# Patient Record
Sex: Female | Born: 1975
Health system: Southern US, Community
[De-identification: ages and names within clinical notes are randomized; demographics above are authoritative.]

## PROBLEM LIST (undated history)

## (undated) DIAGNOSIS — F329 Major depressive disorder, single episode, unspecified: Secondary | ICD-10-CM

## (undated) DIAGNOSIS — T7840XA Allergy, unspecified, initial encounter: Secondary | ICD-10-CM

## (undated) DIAGNOSIS — F32A Depression, unspecified: Secondary | ICD-10-CM

## (undated) HISTORY — DX: Depression, unspecified: F32.A

## (undated) HISTORY — DX: Major depressive disorder, single episode, unspecified: F32.9

## (undated) HISTORY — DX: Allergy, unspecified, initial encounter: T78.40XA

---

## 2005-08-13 ENCOUNTER — Other Ambulatory Visit: Admission: RE | Admit: 2005-08-13 | Discharge: 2005-08-13 | Payer: Self-pay | Admitting: Obstetrics and Gynecology

## 2009-10-24 ENCOUNTER — Inpatient Hospital Stay (HOSPITAL_COMMUNITY): Admission: AD | Admit: 2009-10-24 | Discharge: 2009-10-25 | Payer: Self-pay | Admitting: Obstetrics and Gynecology

## 2010-11-27 LAB — CBC
HCT: 36.9 % (ref 36.0–46.0)
Hemoglobin: 12.7 g/dL (ref 12.0–15.0)
Hemoglobin: 14.4 g/dL (ref 12.0–15.0)
MCHC: 34.3 g/dL (ref 30.0–36.0)
MCV: 92.3 fL (ref 78.0–100.0)
Platelets: 179 10*3/uL (ref 150–400)
RBC: 4 MIL/uL (ref 3.87–5.11)
RBC: 4.54 MIL/uL (ref 3.87–5.11)
WBC: 11.3 10*3/uL — ABNORMAL HIGH (ref 4.0–10.5)
WBC: 12.3 10*3/uL — ABNORMAL HIGH (ref 4.0–10.5)

## 2010-11-27 LAB — RPR: RPR Ser Ql: NONREACTIVE

## 2012-12-23 ENCOUNTER — Telehealth: Payer: Self-pay | Admitting: Family Medicine

## 2012-12-23 ENCOUNTER — Encounter: Payer: Self-pay | Admitting: Family Medicine

## 2012-12-23 ENCOUNTER — Ambulatory Visit (INDEPENDENT_AMBULATORY_CARE_PROVIDER_SITE_OTHER): Payer: BC Managed Care – PPO | Admitting: Family Medicine

## 2012-12-23 ENCOUNTER — Ambulatory Visit: Payer: Self-pay | Admitting: Family Medicine

## 2012-12-23 VITALS — BP 118/80 | HR 84 | Temp 97.5°F | Ht 67.0 in | Wt 130.0 lb

## 2012-12-23 DIAGNOSIS — Z7689 Persons encountering health services in other specified circumstances: Secondary | ICD-10-CM

## 2012-12-23 DIAGNOSIS — H6122 Impacted cerumen, left ear: Secondary | ICD-10-CM

## 2012-12-23 DIAGNOSIS — J309 Allergic rhinitis, unspecified: Secondary | ICD-10-CM

## 2012-12-23 DIAGNOSIS — Z7189 Other specified counseling: Secondary | ICD-10-CM

## 2012-12-23 DIAGNOSIS — H6120 Impacted cerumen, unspecified ear: Secondary | ICD-10-CM

## 2012-12-23 LAB — LIPID PANEL
HDL: 48.3 mg/dL (ref >39.00)
Total CHOL/HDL Ratio: 3
Triglycerides: 93 mg/dL (ref 0.0–149.0)
VLDL: 18.6 mg/dL (ref 0.0–40.0)

## 2012-12-23 LAB — HEMOGLOBIN A1C: Hgb A1c MFr Bld: 5.2 % (ref 4.6–6.5)

## 2012-12-23 MED ORDER — FLUTICASONE PROPIONATE 50 MCG/ACT NA SUSP: 2.0000 | Freq: Every day | NASAL | Status: DC

## 2012-12-23 NOTE — Telephone Encounter (Signed)
Called and spoke with pt and pt is aware.  

## 2012-12-23 NOTE — Patient Instructions (Addendum)
-  We have ordered labs or studies at this visit. It can take up to 1-2 weeks for results and processing. We will contact you with instructions IF your results are abnormal. Normal results will be released to your MYCHART. If you have not heard from us or can not find your results in MYCHART in 2 weeks please contact our office.  -PLEASE SIGN UP FOR MYCHART TODAY   We recommend the following healthy lifestyle measures: - eat a healthy diet consisting of lots of vegetables, fruits, beans, nuts, seeds, healthy meats such as white chicken and fish and whole grains.  - avoid fried foods, fast food, processed foods, sodas, red meet and other fattening foods.  - get a least 150 minutes of aerobic exercise per week.   Follow up in: 1 year or as needed  

## 2012-12-23 NOTE — Telephone Encounter (Signed)
Labs look great.

## 2012-12-23 NOTE — Progress Notes (Signed)
Chief Complaint  Patient presents with  . Establish Care    HPI:  Holly Riddle is here to establish care. Sees Dr. Renaldo Fiddler for gyn appointments. Last PCP and physical: had physical last april  Has the following chronic problems and concerns today:  There is no problem list on file for this patient.  L Ear Fill up: -feels like clogged up and feels like can't hear out of it -has seasonal allergies - sneezing, nasal symptoms, eye watery -just started some claritin -has seen allergist in the past  Health Maintenance: Had tdap in 2010  ROS: See pertinent positives and negatives per HPI.  Past Medical History  Diagnosis Date  . Allergy   . Depression     Family History  Problem Relation Age of Onset  . Alcohol abuse Father   . Hyperlipidemia Father   . Heart disease Father 76  . Hypertension Father   . Cancer Maternal Grandmother     breast  . Cancer Maternal Grandfather     lung  . Diabetes Maternal Grandfather   . Arthritis Paternal Grandmother   . Diabetes Paternal Grandmother   . Mental illness Paternal Grandmother     History   Social History  . Marital Status: Married    Spouse Name: N/A    Number of Children: N/A  . Years of Education: N/A   Social History Main Topics  . Smoking status: Never Smoker   . Smokeless tobacco: None  . Alcohol Use: Yes     Comment: socailly, 1 drink 4 nights per week  . Drug Use: No  . Sexually Active: None   Other Topics Concern  . None   Social History Narrative   Work or School: homemaker      Home Situation: husband, 3 children      Spiritual Beliefs: Christian      Lifestyle: runs - 8 miles per week, diet is goood             Current outpatient prescriptions:loratadine (CLARITIN) 10 MG tablet, Take 10 mg by mouth daily., Disp: , Rfl: ;  fluticasone (FLONASE) 50 MCG/ACT nasal spray, Place 2 sprays into the nose daily., Disp: 16 g, Rfl: 6  EXAM:  Filed Vitals:   12/23/12 0811  BP: 118/80  Pulse: 84   Temp: 97.5 F (36.4 C)    Body mass index is 20.36 kg/(m^2).  GENERAL: vitals reviewed and listed above, alert, oriented, appears well hydrated and in no acute distress  HEENT: atraumatic, conjunttiva clear, no obvious abnormalities on inspection of external nose and ears, normal appearance of ear canals and TMs except cerumen impaction on L, clear nasal congestion, pale boggy turbinates, mild post oropharyngeal erythema with PND, no tonsillar edema or exudate, no sinus TTP  NECK: no obvious masses on inspection  LUNGS: clear to auscultation bilaterally, no wheezes, rales or rhonchi, good air movement  CV: HRRR, no peripheral edema  MS: moves all extremities without noticeable abnormality  PSYCH: pleasant and cooperative, no obvious depression or anxiety  ASSESSMENT AND PLAN:  Discussed the following assessment and plan:  Allergic rhinitis - Plan: fluticasone (FLONASE) 50 MCG/ACT nasal spray  Cerumen impaction, left  Encounter to establish care -We reviewed the PMH, PSH, FH, SH, Meds and Allergies. -cerumen removed and hearing returned -add flonase for AR along with claritin - risks discussed, return prn -NON-FASTING labs  -Patient advised to return or notify a doctor immediately if symptoms worsen or persist or new concerns arise.  Patient Instructions  -  We have ordered labs or studies at this visit. It can take up to 1-2 weeks for results and processing. We will contact you with instructions IF your results are abnormal. Normal results will be released to your [facility]. If you have not heard from [place] or can not find your results in [facility] in 2 weeks please contact our office.  -PLEASE SIGN UP FOR MYCHART TODAY   We recommend the following healthy lifestyle measures: - eat a healthy diet consisting of lots of vegetables, fruits, beans, nuts, seeds, healthy meats such as white chicken and fish and whole grains.  - avoid fried foods, fast food, processed foods, sodas, red  meet and other fattening foods.  - get a least 150 minutes of aerobic exercise per week.   Follow up in:      Kriste Basque R.

## 2013-12-19 ENCOUNTER — Ambulatory Visit (INDEPENDENT_AMBULATORY_CARE_PROVIDER_SITE_OTHER): Payer: BC Managed Care – PPO | Admitting: Family Medicine

## 2013-12-19 ENCOUNTER — Encounter: Payer: Self-pay | Admitting: Family Medicine

## 2013-12-19 VITALS — BP 102/70 | Temp 97.9°F | Wt 131.0 lb

## 2013-12-19 DIAGNOSIS — S61209A Unspecified open wound of unspecified finger without damage to nail, initial encounter: Secondary | ICD-10-CM

## 2013-12-19 DIAGNOSIS — L609 Nail disorder, unspecified: Secondary | ICD-10-CM

## 2013-12-19 DIAGNOSIS — J309 Allergic rhinitis, unspecified: Secondary | ICD-10-CM

## 2013-12-19 DIAGNOSIS — S61219A Laceration without foreign body of unspecified finger without damage to nail, initial encounter: Secondary | ICD-10-CM

## 2013-12-19 DIAGNOSIS — L608 Other nail disorders: Secondary | ICD-10-CM

## 2013-12-19 MED ORDER — FLUTICASONE PROPIONATE 50 MCG/ACT NA SUSP: 2.0000 | Freq: Every day | NASAL | Status: DC

## 2013-12-19 NOTE — Progress Notes (Signed)
Chief Complaint  Patient presents with  . left pointer finger sore    HPI:  Finger Laceration: -occurred a few days ago when fell -R index finger -not really painful, no swelling or oozing -cleaned well and applying neopsporin  Allergic Rhinitis: -on flonase and antihistamine - needs refill on flonase  Tonail issues: -most toenails a little discolered -keeps painted  ROS: See pertinent positives and negatives per HPI.  Past Medical History  Diagnosis Date  . Allergy   . Depression     No past surgical history on file.  Family History  Problem Relation Age of Onset  . Alcohol abuse Father   . Hyperlipidemia Father   . Heart disease Father [id number]  . Hypertension Father   . Cancer Maternal Grandmother     breast  . Cancer Maternal Grandfather     lung  . Diabetes Maternal Grandfather   . Arthritis Paternal Grandmother   . Diabetes Paternal Grandmother   . Mental illness Paternal Grandmother     History   Social History  . Marital Status: Married    Spouse Name: N/A    Number of Children: N/A  . Years of Education: N/A   Social History Main Topics  . Smoking status: Never Smoker   . Smokeless tobacco: None  . Alcohol Use: Yes     Comment: socailly, 1 drink 4 nights per week  . Drug Use: No  . Sexual Activity: None   Other Topics Concern  . None   Social History Narrative   Work or School: homemaker      Home Situation: husband, 3 children      Spiritual Beliefs: Christian      Lifestyle: runs - 8 miles per week, diet is goood             Current outpatient prescriptions:fluticasone (FLONASE) 50 MCG/ACT nasal spray, Place 2 sprays into both nostrils daily., Disp: 16 g, Rfl: 6;  loratadine (CLARITIN) 10 MG tablet, Take 10 mg by mouth daily., Disp: , Rfl:   EXAM:  Filed Vitals:   12/19/13 1101  BP: 102/70  Temp: 97.9 F (36.6 C)    Body mass index is 20.51 kg/(m^2).  GENERAL: vitals reviewed and listed above, alert, oriented, appears  well hydrated and in no acute distress  HEENT: atraumatic, conjunttiva clear, no obvious abnormalities on inspection of external nose and ears  NECK: no obvious masses on inspection  SKIN: healing abrasion R index finger, good granulation tissue, no signs of infection; most tonails mily friable with white discoloration  MS: moves all extremities without noticeable abnormality  PSYCH: pleasant and cooperative, no obvious depression or anxiety  ASSESSMENT AND PLAN:  Discussed the following assessment and plan:  Allergic rhinitis - Plan: fluticasone (FLONASE) 50 MCG/ACT nasal spray -refilled flonase  Finger laceration -appears to be healing well, wound care recs and return precautions provided  Toenail deformity -discussed potential etiologies and treatments and risks -she opted to try topical tx, no nail polish and consider oral antifungal, but holding off on this for now  -Patient advised to return or notify a doctor immediately if symptoms worsen or persist or new concerns arise.  There are no Patient Instructions on file for this visit.   Terressa KoyanagiHannah R. Kyler Lerette

## 2013-12-19 NOTE — Progress Notes (Signed)
Pre visit review using our clinic review tool, if applicable. No additional management support is needed unless otherwise documented below in the visit note. 

## 2014-10-30 ENCOUNTER — Other Ambulatory Visit: Payer: Self-pay | Admitting: *Deleted

## 2014-10-30 DIAGNOSIS — J3089 Other allergic rhinitis: Secondary | ICD-10-CM

## 2014-10-30 MED ORDER — FLUTICASONE PROPIONATE 50 MCG/ACT NA SUSP: 2.0000 | Freq: Every day | NASAL | Status: AC

## 2014-10-30 NOTE — Telephone Encounter (Signed)
Rx done. 

## 2015-01-30 ENCOUNTER — Encounter: Payer: Self-pay | Admitting: Family Medicine

## 2015-01-30 ENCOUNTER — Ambulatory Visit (INDEPENDENT_AMBULATORY_CARE_PROVIDER_SITE_OTHER): Payer: 59 | Admitting: Family Medicine

## 2015-01-30 VITALS — BP 100/80 | HR 75 | Temp 97.4°F | Ht 67.0 in | Wt 129.9 lb

## 2015-01-30 DIAGNOSIS — M79672 Pain in left foot: Secondary | ICD-10-CM | POA: Diagnosis not present

## 2015-01-30 DIAGNOSIS — M79671 Pain in right foot: Secondary | ICD-10-CM

## 2015-01-30 NOTE — Progress Notes (Signed)
HPI:  Acute visit for:  1)Foot Pain: -started 2 weeks ago after increasing long runs in preparation for half marathon 1 week ago -pain is in both feet, constant, in bilat plantar aspect of feet L>R,  worse with on feet, standing or with running -has not done any running in the last 1 week and not much improved -takes ibuprofen once daily and helps a little -she wore orthotic from off and running the last week of training before half -denies: fevers, malaise, weakness numbness  ROS: See pertinent positives and negatives per HPI.  Past Medical History  Diagnosis Date  . Allergy   . Depression     No past surgical history on file.  Family History  Problem Relation Age of Onset  . Alcohol abuse Father   . Hyperlipidemia Father   . Heart disease Father [id number]  . Hypertension Father   . Cancer Maternal Grandmother     breast  . Cancer Maternal Grandfather     lung  . Diabetes Maternal Grandfather   . Arthritis Paternal Grandmother   . Diabetes Paternal Grandmother   . Mental illness Paternal Grandmother     History   Social History  . Marital Status: Married    Spouse Name: N/A  . Number of Children: N/A  . Years of Education: N/A   Social History Main Topics  . Smoking status: Never Smoker   . Smokeless tobacco: Not on file  . Alcohol Use: Yes     Comment: socailly, 1 drink 4 nights per week  . Drug Use: No  . Sexual Activity: Not on file   Other Topics Concern  . None   Social History Narrative   Work or School: homemaker      Home Situation: husband, 3 children      Spiritual Beliefs: Christian      Lifestyle: runs - 8 miles per week, diet is goood              Current outpatient prescriptions:  .  fluticasone (FLONASE) 50 MCG/ACT nasal spray, Place 2 sprays into both nostrils daily., Disp: 16 g, Rfl: 3 .  loratadine (CLARITIN) 10 MG tablet, Take 10 mg by mouth daily., Disp: , Rfl:   EXAM:  Filed Vitals:   01/30/15 1029  BP: 100/80  Pulse: 75   Temp: 97.4 F (36.3 C)    Body mass index is 20.34 kg/(m^2).  GENERAL: vitals reviewed and listed above, alert, oriented, appears well hydrated and in no acute distress  HEENT: atraumatic, conjunttiva clear, no obvious abnormalities on inspection of external nose and ears  CV: HRRR, no peripheral edema  MS/SKIN/NEURO: moves all extremities without noticeable abnormality Normal gait, mild talus valgus with mild pes planus L and some collapes of bilat ant arch with callous formation mild r ant plantar foot. Shoes show mild medial post wear R and bilat mild medial toe wear. No sig soft tissue, joint or bony TTP on exam, no swelling, normal ant/post drawer, normal talar tilt, mildly tight gastrocs bilat, normal strength throughout and NV intact bilat in feet/toes.  PSYCH: pleasant and cooperative, no obvious depression or anxiety  ASSESSMENT AND PLAN:  Discussed the following assessment and plan:  Foot pain, bilateral - Plan: DG Foot Complete Left -suspect strain, possible plantar fasciitis, orthotic may be causing compression - opted to hold off on this for now -opted for foot wear and activity change, xrays to exclude possible comp fx - unlikely -follow up in 1 month - may  have her see Dr. Katrinka Blazing if persists  -Patient advised to return or notify a doctor immediately if symptoms worsen or persist or new concerns arise.  Patient Instructions  BEFORE YOU LEAVE: -xray sheet -follow up in 3-4 weeks if persist  Try the [place] sock at night  No running or jogging for the next few weeks  Stick to cycling, elliptical and walking for the next few weeks  Go without the orthotic for now  Tylenol 500-1000mg  up to 3 times per day or aleve 1-2 tablets up to 2 times per day     Kriste Basque R.

## 2015-01-30 NOTE — Patient Instructions (Signed)
BEFORE YOU LEAVE: -xray sheet -follow up in 3-4 weeks if persist  Try the [place] sock at night  No running or jogging for the next few weeks  Stick to cycling, elliptical and walking for the next few weeks  Go without the orthotic for now  Tylenol 500-1000mg  up to 3 times per day or aleve 1-2 tablets up to 2 times per day

## 2015-01-30 NOTE — Progress Notes (Signed)
Pre visit review using our clinic review tool, if applicable. No additional management support is needed unless otherwise documented below in the visit note. 

## 2015-02-19 ENCOUNTER — Ambulatory Visit (INDEPENDENT_AMBULATORY_CARE_PROVIDER_SITE_OTHER)
Admission: RE | Admit: 2015-02-19 | Discharge: 2015-02-19 | Disposition: A | Discharge status: Home / Self Care | Payer: 59 | Source: Ambulatory Visit | Attending: Family Medicine | Admitting: Family Medicine

## 2015-02-19 ENCOUNTER — Other Ambulatory Visit: Payer: Self-pay | Admitting: Family Medicine

## 2015-02-19 DIAGNOSIS — M79671 Pain in right foot: Secondary | ICD-10-CM | POA: Diagnosis not present

## 2015-03-05 ENCOUNTER — Ambulatory Visit: Payer: 59 | Admitting: Family Medicine

## 2015-03-13 ENCOUNTER — Encounter: Payer: Self-pay | Admitting: Family Medicine

## 2015-03-13 ENCOUNTER — Ambulatory Visit (INDEPENDENT_AMBULATORY_CARE_PROVIDER_SITE_OTHER): Payer: 59 | Admitting: Family Medicine

## 2015-03-13 ENCOUNTER — Other Ambulatory Visit (INDEPENDENT_AMBULATORY_CARE_PROVIDER_SITE_OTHER): Payer: 59

## 2015-03-13 VITALS — BP 106/64 | HR 74 | Ht 67.0 in | Wt 130.0 lb

## 2015-03-13 DIAGNOSIS — M2580 Other specified joint disorders, unspecified joint: Secondary | ICD-10-CM | POA: Diagnosis not present

## 2015-03-13 DIAGNOSIS — M79671 Pain in right foot: Secondary | ICD-10-CM

## 2015-03-13 NOTE — Assessment & Plan Note (Signed)
Patient seems to be improving with her decreasing her running recently. We discussed icing regimen and patient given topical anti-inflammatory's. We discussed proper shoewear. We discussed slowly increasing her activity over the course the next 3-4 weeks. We discussed vitamin D supplementation. Patient come back and see me again in 3-4 weeks for further evaluation and treatment.

## 2015-03-13 NOTE — Progress Notes (Signed)
Pre visit review using our clinic review tool, if applicable. No additional management support is needed unless otherwise documented below in the visit note. 

## 2015-03-13 NOTE — Progress Notes (Signed)
Tawana ScaleZach Masoud Nyce D.O. [facility] [address] [place], [place] [address] Phone: [phone]) [phone] Subjective:    I'm seeing this patient by the request  of:  Terressa KoyanagiKIM, HANNAH R., DO   CC: Right foot pain  [id number]:Subjective Clearnce HastenKristen Riddle is a 39 y.o. female coming in with complaint of right foot pain. Patient is an avid runner and has been running greater distances recently. Patient has noticed that she is started having more of right foot pain. Patient has been preparing for a half marathon. Patient actually states that the pain seems to be in both of the feet. Patient describes the pain as more of a dull throbbing aching sensation that is worse with activity. Seems to be better with rest. Patient has taken a week off from running and has noticed significant improvement. Patient denies any fevers or chills or any abnormal weight loss. Patient denies any numbness of the toes. Patient states a regular daily activities have no pain. Patient states it's only with running itself. Has changed shoes with no significant improvement. Rates the severity of 5 out of 10. Patient did run a half marathon and did well but then the pain started seems to get worse 2 weeks ago.  Past Medical History  Diagnosis Date  . Allergy   . Depression    No past surgical history on file. History  Substance Use Topics  . Smoking status: Never Smoker   . Smokeless tobacco: Not on file  . Alcohol Use: Yes     Comment: socailly, 1 drink 4 nights per week   No Known Allergies Family History  Problem Relation Age of Onset  . Alcohol abuse Father   . Hyperlipidemia Father   . Heart disease Father [id number]  . Hypertension Father   . Cancer Maternal Grandmother     breast  . Cancer Maternal Grandfather     lung  . Diabetes Maternal Grandfather   . Arthritis Paternal Grandmother   . Diabetes Paternal Grandmother   . Mental illness Paternal Grandmother         Past medical history, social, surgical and family history  all reviewed in electronic medical record.   Review of Systems: No headache, visual changes, nausea, vomiting, diarrhea, constipation, dizziness, abdominal pain, skin rash, fevers, chills, night sweats, weight loss, swollen lymph nodes, body aches, joint swelling, muscle aches, chest pain, shortness of breath, mood changes.   Objective Blood pressure 106/64, pulse 74, height 5\' 7"  (1.702 m), weight 130 lb (58.968 kg), SpO2 98 %.  General: No apparent distress alert and oriented x3 mood and affect normal, dressed appropriately.  HEENT: Pupils equal, extraocular movements intact  Respiratory: Patient's speak in full sentences and does not appear short of breath  Cardiovascular: No lower extremity edema, non tender, no erythema  Skin: Warm dry intact with no signs of infection or rash on extremities or on axial skeleton.  Abdomen: Soft nontender  Neuro: Cranial nerves II through XII are intact, neurovascularly intact in all extremities with 2+ DTRs and 2+ pulses.  Lymph: No lymphadenopathy of posterior or anterior cervical chain or axillae bilaterally.  Gait normal with good balance and coordination.  MSK:  Non tender with full range of motion and good stability and symmetric strength and tone of shoulders, elbows, wrist, hip, knees bilaterally.  Ankle: Right No visible erythema or swelling. Range of motion is full in all directions. Strength is 5/5 in all directions. Stable lateral and medial ligaments; squeeze test and kleiger test unremarkable; Talar  dome nontender; No pain at base of 5th MT; No tenderness over cuboid; No tenderness over N spot or navicular prominence No tenderness on posterior aspects of lateral and medial malleolus Tender to palpation on the plantar aspect of the first and second metatarsal bones. No sign of peroneal tendon subluxations or tenderness to palpation Negative tarsal tunnel tinel's Able to walk 4 steps.  MSK US performed of: Right This study was ordered,  performed, and interpreted by Terrilee Files D.O.  Foot/Ankle:   All structures visualized.   Talar dome unremarkable  Ankle mortise without effusion. Peroneus longus and brevis tendons unremarkable on long and transverse views without sheath effusions. Posterior tibialis, flexor hallucis longus, and flexor digitorum longus tendons unremarkable on long and transverse views without sheath effusions. Achilles tendon visualized along length of tendon and unremarkable on long and transverse views without sheath effusion. Anterior Talofibular Ligament and Calcaneofibular Ligaments unremarkable and intact. Deltoid Ligament unremarkable and intact. Plantar fascia intact and without effusion, normal thickness. No increased doppler signal, cap sign, or thickening of tibial cortex. Patient though does have some mild hypoechoic changes of the sesamoid bones on their the first and second toes. Very nonspecific but mild increase in Doppler flow.   IMPRESSION: Sesamoiditis      Impression and Recommendations:     This case required medical decision making of moderate complexity.

## 2015-03-13 NOTE — Patient Instructions (Signed)
Nice to meet you You can try Pennsaid (topical medicine) on your foot, 2x/day as needed Ice/ice bath after activity and before bedtime  Wear shoes inside to give your foot support and padding  Try Spenco orthotics - can find online (with metatarsal pad) No running this week - cross train bike/ellipse/swimming Vitamin D - /day See me again in 3 weeks.

## 2015-04-03 ENCOUNTER — Ambulatory Visit: Payer: 59 | Admitting: Family Medicine

## 2015-06-25 ENCOUNTER — Ambulatory Visit (INDEPENDENT_AMBULATORY_CARE_PROVIDER_SITE_OTHER): Payer: 59 | Admitting: Family Medicine

## 2015-06-25 ENCOUNTER — Encounter: Payer: Self-pay | Admitting: Family Medicine

## 2015-06-25 VITALS — BP 124/70 | HR 61 | Ht 67.0 in | Wt 131.0 lb

## 2015-06-25 DIAGNOSIS — M2580 Other specified joint disorders, unspecified joint: Secondary | ICD-10-CM

## 2015-06-25 DIAGNOSIS — M216X1 Other acquired deformities of right foot: Secondary | ICD-10-CM | POA: Diagnosis not present

## 2015-06-25 MED ORDER — PREDNISONE 50 MG PO TABS: 50.0000 mg | ORAL_TABLET | Freq: Every day | ORAL | Status: DC

## 2015-06-25 NOTE — Progress Notes (Signed)
Pre visit review using our clinic review tool, if applicable. No additional management support is needed unless otherwise documented below in the visit note. 

## 2015-06-25 NOTE — Patient Instructions (Signed)
Prednisone daily for 5 days.  Ice still at night Vitamin D is most important We will get you set up for orthotics.  Start next week. Only do it 3 times a week.  Start a walk-run progression: - Initially start one minute walking than one minute running for 20 mins in the first week,   then 25 mins during the second week, then 30 mins afterwards.  Once you have reached 30 mins: - Run 2 mins, then walk 1 min. -Then run 3 mins, and walk 1 min. -Then run 4 mins, and walk 1 min. -Then run 5 mins, and walk 1 min. -Slowly build up weekly to running 30 mins nonstop.  If painful at any of the steps, back up one step.  See me again 3 weeks after the orthotics.

## 2015-06-25 NOTE — Assessment & Plan Note (Signed)
Patient attends to have more of a sesamoiditis secondary to the breakdown of the transverse arch. Patient does have a narrow hindfoot. Patient be fitted with custom orthotics. We discussed prednisone for short course. Patient is going to take this daily for 5 days. We discussed icing regimen as well as topical anti-inflammatories. Patient is going to continue some the exercises. Patient will place her shoes differently. We discussed proper shoewear. Patient come back and see me again in 4 weeks for further evaluation and treatment. Custom orthotics ordered.  Spent  25 minutes with patient face-to-face and had greater than 50% of counseling including as described above in assessment and plan.

## 2015-06-25 NOTE — Progress Notes (Signed)
[name] [address] [place], [place] [address] Phone: [phone] Subjective:    I'm seeing this patient by the request  of:  Terressa Koyanagi., DO   CC: Right foot pain follow-up  [id number] Holly Riddle is a 39 y.o. female coming in with complaint of right foot pain. Patient was seen previously and did have more of a sesamoiditis. Patient was to try conservative therapy including icing, proper shoe wear, in augmenting some of her exercises. Patient states she was doing better but when she tries to increase her walking or running she starts having increasing pain again. Patient states that it still seems to be around the toes. Having some mild pain at the heel as well. Denies any other significant changes. Patient did get the over-the-counter orthotics and continues to do the vitamins on a regular basis. Maybe not as bad as it was previously but not making any improvement she would want to him is unable to start increasing her running on a regular basis.    Past Medical History  Diagnosis Date  . Allergy   . Depression    No past surgical history on file. Social History  Substance Use Topics  . Smoking status: Never Smoker   . Smokeless tobacco: None  . Alcohol Use: Yes     Comment: socailly, 1 drink 4 nights per week   No Known Allergies Family History  Problem Relation Age of Onset  . Alcohol abuse Father   . Hyperlipidemia Father   . Heart disease Father 20  . Hypertension Father   . Cancer Maternal Grandmother     breast  . Cancer Maternal Grandfather     lung  . Diabetes Maternal Grandfather   . Arthritis Paternal Grandmother   . Diabetes Paternal Grandmother   . Mental illness Paternal Grandmother         Past medical history, social, surgical and family history all reviewed in electronic medical record.   Review of Systems: No headache, visual changes, nausea, vomiting, diarrhea, constipation, dizziness, abdominal pain,  skin rash, fevers, chills, night sweats, weight loss, swollen lymph nodes, body aches, joint swelling, muscle aches, chest pain, shortness of breath, mood changes.   Objective Blood pressure 124/70, pulse 61, height  (1.702 m), weight 131 lb (59.421 kg), SpO2 98 %.  General: No apparent distress alert and oriented x3 mood and affect normal, dressed appropriately.  HEENT: Pupils equal, extraocular movements intact  Respiratory: Patient's speak in full sentences and does not appear short of breath  Cardiovascular: No lower extremity edema, non tender, no erythema  Skin: Warm dry intact with no signs of infection or rash on extremities or on axial skeleton.  Abdomen: Soft nontender  Neuro: Cranial nerves II through XII are intact, neurovascularly intact in all extremities with 2+ DTRs and 2+ pulses.  Lymph: No lymphadenopathy of posterior or anterior cervical chain or axillae bilaterally.  Gait normal with good balance and coordination.  MSK:  Non tender with full range of motion and good stability and symmetric strength and tone of shoulders, elbows, wrist, hip, knees bilaterally.  Ankle: Right No visible erythema or swelling. Range of motion is full in all directions. Strength is 5/5 in all directions. Stable lateral and medial ligaments; squeeze test and kleiger test unremarkable; Talar dome nontender; No pain at base of 5th MT; No tenderness over cuboid; No tenderness over N spot or navicular prominence No tenderness on posterior aspects of lateral and medial malleolus  Continue mild tenderness to palpation over the metatarsal pad No sign of peroneal tendon subluxations or tenderness to palpation Negative tarsal tunnel tinel's Able to walk 4 steps. Foot exam shows the patient does have hammer toeing of the fourth and fifth toes as well as splaying between the first and second toe with inversion of the transverse arch. Contralateral ankle and foot unremarkable.       Impression  and Recommendations:     This case required medical decision making of moderate complexity.

## 2015-07-04 ENCOUNTER — Encounter: Payer: Self-pay | Admitting: Family Medicine

## 2015-07-04 ENCOUNTER — Ambulatory Visit (INDEPENDENT_AMBULATORY_CARE_PROVIDER_SITE_OTHER): Payer: 59 | Admitting: Family Medicine

## 2015-07-04 DIAGNOSIS — M216X1 Other acquired deformities of right foot: Secondary | ICD-10-CM | POA: Diagnosis not present

## 2015-07-04 NOTE — Assessment & Plan Note (Signed)
Patient was placed in orthotics today. We discussed icing regimen as well as slowly increase the wear these orthotics over the course of time here. Patient will come back in 4 weeks for further evaluation and treatment.

## 2015-07-04 NOTE — Progress Notes (Signed)
Patient was fitted for a : standard, cushioned, semi-rigid orthotic. The orthotic was heated and afterward the patient was in a seated position and the orthotic molded. The patient was positioned in subtalar neutral position and 10 degrees of ankle dorsiflexion in a non-weight bearing stance. After completion of molding, patient did have orthotic management which included instructions on acclimating to the orthotics, signs of ill fit as well as care for the orthotic.  I spent approximately with the patient fitting the orthotics as well as discussing transfer between shoes and proper care.   The blank was ground to a stable position for weight bearing. Size: 8.5 (Igli Active()  Base: Carbon fiber Additional Posting and Padding: The following postings were fitted onto the molded orthotics to help maintain a talar neutral position - Wedge posting for transverse arch:   250/35     Silicone posting for longitudinal arch: 250/100 and 250/70 bilaterally  The patient ambulated these, and they were very comfortable and supportive.

## 2015-07-04 NOTE — Patient Instructions (Signed)
Acclimating to your Igli orthotics -  ° °We recommend that you allow up to 2 weeks for you to fully acclimate to your new custom orthotics. Please use the following recommended plan to build into full day wear.  ° °Day 1 - 2hours/day °Every day afterwards add 1 hour of wear(3hrs/day, 4hrs/day, etc) until you are able to wear them for an entire day without issues.  ° °If you notice any irritation or increasing discomfort with your new orthotics, please do not hesitate in contacting the office(leave a message for Nahlia Hellmann or Lindsay) or sending Latacha Texeira a message through MyChart to arrange a time to review your fit.  ° °Enjoy your new orthotics!!  ° °Holly Riddle ° ° ° °

## 2015-07-12 ENCOUNTER — Encounter: Payer: Self-pay | Admitting: Family Medicine

## 2015-07-25 ENCOUNTER — Encounter: Payer: Self-pay | Admitting: Family Medicine

## 2015-07-25 ENCOUNTER — Other Ambulatory Visit (INDEPENDENT_AMBULATORY_CARE_PROVIDER_SITE_OTHER): Payer: 59

## 2015-07-25 ENCOUNTER — Ambulatory Visit (INDEPENDENT_AMBULATORY_CARE_PROVIDER_SITE_OTHER): Payer: 59 | Admitting: Family Medicine

## 2015-07-25 VITALS — BP 112/72 | HR 87 | Ht 67.0 in | Wt 131.0 lb

## 2015-07-25 DIAGNOSIS — M216X1 Other acquired deformities of right foot: Secondary | ICD-10-CM

## 2015-07-25 NOTE — Assessment & Plan Note (Signed)
Still believe that the loss of the transverse arch is what is contributing to this. I do not feel that prednisone again would be beneficial. Patient will try topical anti-inflammatories. We will increase her vitamin D supplementation. Patient did have changes increasing the metatarsal pad length which hopefully will be beneficial. Giving some mild more stability of the hindfoot as well. Patient then will come back and see me again in 2-3 weeks. Continuing to have trouble we may need to consider a postop boot. Do not feel that advance imaging will be warranted.

## 2015-07-25 NOTE — Progress Notes (Signed)
Tawana ScaleZach Elizah Lydon D.O. [facility] [address] [place], [place] [address] Phone: [phone] Subjective:    I'm seeing this patient by the request  of:  Terressa KoyanagiKIM, HANNAH R., DO   CC: Right foot pain follow-up  BJY:[id number]:Subjective Holly HastenKristen Riddle is a 39 y.o. female coming in with complaint of right foot pain. Patient was seen previously and did have more of a sesamoiditis. Patient was to try conservative therapy including icing, proper shoe wear, in augmenting some of her exercises. Patient overall has been doing relatively better. Continuing to have low discomfort on the right foot. Patient was putting custom orthotics and continues to have discomfort. Patient states it seemed to be more over the second toe. Patient states that it is affecting her when she tries to increase her activity. Not affecting her with her diarrhea daily activities. No swelling or numbness noted.    Past Medical History  Diagnosis Date  . Allergy   . Depression    No past surgical history on file. Social History  Substance Use Topics  . Smoking status: Never Smoker   . Smokeless tobacco: Not on file  . Alcohol Use: Yes     Comment: socailly, 1 drink 4 nights per week   No Known Allergies Family History  Problem Relation Age of Onset  . Alcohol abuse Father   . Hyperlipidemia Father   . Heart disease Father [id number]  . Hypertension Father   . Cancer Maternal Grandmother     breast  . Cancer Maternal Grandfather     lung  . Diabetes Maternal Grandfather   . Arthritis Paternal Grandmother   . Diabetes Paternal Grandmother   . Mental illness Paternal Grandmother         Past medical history, social, surgical and family history all reviewed in electronic medical record.   Review of Systems: No headache, visual changes, nausea, vomiting, diarrhea, constipation, dizziness, abdominal pain, skin rash, fevers, chills, night sweats, weight loss, swollen lymph nodes, body aches, joint swelling, muscle aches,  chest pain, shortness of breath, mood changes.   Objective There were no vitals taken for this visit.  General: No apparent distress alert and oriented x3 mood and affect normal, dressed appropriately.  HEENT: Pupils equal, extraocular movements intact  Respiratory: Patient's speak in full sentences and does not appear short of breath  Cardiovascular: No lower extremity edema, non tender, no erythema  Skin: Warm dry intact with no signs of infection or rash on extremities or on axial skeleton.  Abdomen: Soft nontender  Neuro: Cranial nerves II through XII are intact, neurovascularly intact in all extremities with 2+ DTRs and 2+ pulses.  Lymph: No lymphadenopathy of posterior or anterior cervical chain or axillae bilaterally.  Gait normal with good balance and coordination.  MSK:  Non tender with full range of motion and good stability and symmetric strength and tone of shoulders, elbows, wrist, hip, knees bilaterally.  Ankle: Right No visible erythema or swelling. Range of motion is full in all directions. Strength is 5/5 in all directions. Stable lateral and medial ligaments; squeeze test and kleiger test unremarkable; Talar dome nontender; No pain at base of 5th MT; No tenderness over cuboid; No tenderness over N spot or navicular prominence No tenderness on posterior aspects of lateral and medial malleolus Continue mild tenderness to palpation over the metatarsal pad No sign of peroneal tendon subluxations or tenderness to palpation Negative tarsal tunnel tinel's Able to walk 4 steps. Foot exam shows the patient does have hammer  toeing of the fourth and fifth toes as well as splaying between the first and second toe with inversion of the transverse arch. Morton's toe noted on the right foot. Patient is tender to palpation over the second metatarsal. Contralateral ankle and foot unremarkable.  Limited muscular skeletal ultrasound was performed and interpreted by Antoine Primas, M    Patient's previous assessment by this in this area and does still have some hypoechoic changes but seems to be somewhat improved. Patient though does have hypoechoic changes on the plantar aspect of the second metatarsal. Mild increasing Doppler flow. Impression: Possible early stress reaction of the second metatarsal.    Impression and Recommendations:     This case required medical decision making of moderate complexity.

## 2015-07-25 NOTE — Patient Instructions (Signed)
Good to see you Ice is your friend when needed Lets try the changes we made Start the vitamin D to 4000IU daily for next 2 weeks Continue the multi-vitamin Decrease activity 25% frequency and duration and increase 10% a week.  Send message in 2 weeks and if not better I would like to put you in a boot Otherwise see me again in 4 weeks

## 2015-07-25 NOTE — Progress Notes (Signed)
Pre visit review using our clinic review tool, if applicable. No additional management support is needed unless otherwise documented below in the visit note. 

## 2015-08-06 ENCOUNTER — Encounter: Payer: Self-pay | Admitting: Family Medicine

## 2015-08-22 ENCOUNTER — Other Ambulatory Visit (INDEPENDENT_AMBULATORY_CARE_PROVIDER_SITE_OTHER): Payer: 59

## 2015-08-22 ENCOUNTER — Ambulatory Visit (INDEPENDENT_AMBULATORY_CARE_PROVIDER_SITE_OTHER): Payer: 59 | Admitting: Family Medicine

## 2015-08-22 ENCOUNTER — Encounter: Payer: Self-pay | Admitting: Family Medicine

## 2015-08-22 VITALS — BP 94/60 | HR 87 | Ht 67.0 in | Wt 131.0 lb

## 2015-08-22 DIAGNOSIS — M216X1 Other acquired deformities of right foot: Secondary | ICD-10-CM

## 2015-08-22 DIAGNOSIS — M2580 Other specified joint disorders, unspecified joint: Secondary | ICD-10-CM | POA: Diagnosis not present

## 2015-08-22 NOTE — Patient Instructions (Signed)
Great to see you You are healing incredibly fast! Get back to the shoe with the orthotic Start though 2 hours first day and increase 1-2 hours daily Vitamin D 2000 IU daily Avoid being barefoot If pain take ibuprofen 600mg  3 times a day for 3 days  See me again in 6 weeks if not perfect Happy holidays!

## 2015-08-22 NOTE — Progress Notes (Signed)
[name] [address] [place], [place] [address] Phone: [phone] Subjective:    I'm seeing this patient by the request  of:  Terressa Koyanagi., DO   CC: Right foot pain follow-up  [id number] Holly Riddle is a 39 y.o. female coming in with complaint of right foot pain. Patient was seen previously and did have more of a sesamoiditis. Patient was to try conservative therapy including icing, proper shoe wear, in augmenting some of her exercises. Patient overall has been doing relatively better. Continuing to have low discomfort on the right foot. 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 States that she is no longer having any pain in the forefoot but now having more tightness of the posterior aspect of the foot. Denies any swelling.    Past Medical History  Diagnosis Date  . Allergy   . Depression    No past surgical history on file. Social History  Substance Use Topics  . Smoking status: Never Smoker   . Smokeless tobacco: None  . Alcohol Use: Yes     Comment: socailly, 1 drink 4 nights per week   No Known Allergies Family History  Problem Relation Age of Onset  . Alcohol abuse Father   . Hyperlipidemia Father   . Heart disease Father 33  . Hypertension Father   . Cancer Maternal Grandmother     breast  . Cancer Maternal Grandfather     lung  . Diabetes Maternal Grandfather   . Arthritis Paternal Grandmother   . Diabetes Paternal Grandmother   . Mental illness Paternal Grandmother         Past medical history, social, surgical and family history all reviewed in electronic medical record.   Review of Systems: No headache, visual changes, nausea, vomiting, diarrhea, constipation, dizziness, abdominal pain, skin rash, fevers, chills, night sweats, weight loss, swollen lymph nodes, body aches, joint swelling, muscle aches, chest pain, shortness of breath, mood changes.   Objective Blood pressure 94/60, pulse 87,  height  (1.702 m), weight 131 lb (59.421 kg), SpO2 99 %.  General: No apparent distress alert and oriented x3 mood and affect normal, dressed appropriately.  HEENT: Pupils equal, extraocular movements intact  Respiratory: Patient's speak in full sentences and does not appear short of breath  Cardiovascular: No lower extremity edema, non tender, no erythema  Skin: Warm dry intact with no signs of infection or rash on extremities or on axial skeleton.  Abdomen: Soft nontender  Neuro: Cranial nerves II through XII are intact, neurovascularly intact in all extremities with 2+ DTRs and 2+ pulses.  Lymph: No lymphadenopathy of posterior or anterior cervical chain or axillae bilaterally.  Gait normal with good balance and coordination.  MSK:  Non tender with full range of motion and good stability and symmetric strength and tone of shoulders, elbows, wrist, hip, knees bilaterally.  Ankle: Right No visible erythema or swelling. Range of motion is full in all directions. Strength is 5/5 in all directions. Stable lateral and medial ligaments; squeeze test and kleiger test unremarkable; Talar dome nontender; No pain at base of 5th MT; No tenderness over cuboid; No tenderness over N spot or navicular prominence No tenderness on posterior aspects of lateral and medial malleolus Nontender on exam today. No sign of peroneal tendon subluxations or tenderness to palpation Negative tarsal tunnel tinel's Able to walk 4 steps. Foot exam shows the patient does have hammer toeing of the fourth and fifth  toes as well as splaying between the first and second toe with inversion of the transverse arch. Morton's toe noted on the right foot. Patient is tender to palpation over the second metatarsal. Contralateral ankle and foot unremarkable.  Limited muscular skeletal ultrasound was performed and interpreted by Antoine PrimasSMITH, ZACHARY, M  Second metatarsal where patient did have the increasing hypoechoic changes is  significantly improved. Mild reabsorption of the bone noted. No significant hypoechoic changes still remaining but still some mild increase in Doppler flow. Impression: Healed stress reaction of the second metatarsal.    Impression and Recommendations:     This case required medical decision making of moderate complexity.

## 2015-08-22 NOTE — Progress Notes (Signed)
Pre visit review using our clinic review tool, if applicable. No additional management support is needed unless otherwise documented below in the visit note. 

## 2015-08-22 NOTE — Assessment & Plan Note (Signed)
Significant better at this time. Encourage patient to start be more active. Patient will take anti-inflammatories if needed. Patient was started increasing her activity at 50% duration and frequency of her exercises and encouraged him percent week. We will see her back again in 6 weeks for further evaluation.

## 2015-10-03 ENCOUNTER — Ambulatory Visit: Payer: 59 | Admitting: Family Medicine

## 2015-12-17 ENCOUNTER — Ambulatory Visit (INDEPENDENT_AMBULATORY_CARE_PROVIDER_SITE_OTHER): Payer: 59 | Admitting: Family Medicine

## 2015-12-17 ENCOUNTER — Encounter: Payer: Self-pay | Admitting: Family Medicine

## 2015-12-17 VITALS — BP 98/50 | HR 82 | Temp 98.1°F | Ht 67.0 in | Wt 132.1 lb

## 2015-12-17 DIAGNOSIS — F411 Generalized anxiety disorder: Secondary | ICD-10-CM | POA: Insufficient documentation

## 2015-12-17 DIAGNOSIS — F331 Major depressive disorder, recurrent, moderate: Secondary | ICD-10-CM | POA: Insufficient documentation

## 2015-12-17 MED ORDER — SERTRALINE HCL 50 MG PO TABS: 50.0000 mg | ORAL_TABLET | Freq: Every day | ORAL | Status: DC

## 2015-12-17 NOTE — Progress Notes (Signed)
Pre visit review using our clinic review tool, if applicable. No additional management support is needed unless otherwise documented below in the visit note. 

## 2015-12-17 NOTE — Progress Notes (Signed)
HPI:  Holly Riddle is a pleasant 40 year old here for an acute visit for the following issues:  1) depression and anxiety: -Long-standing, for many years, on antidepressants in the past -Reports Zoloft worked well in the past -Worsening over the last 6 months -Symptoms include: Down and depressed mood daily, irritable mood daily, feelings of hopelessness at times, generalized worry, poor sleep -denies: SI, thoughts of self harm, panic attacks, manic symptoms -has had counseling in the past, but she did not find it helpful -depressed about chronic foot and knee pain - seeing sports medicine, feels frustrated has not been able to get back into running, fearful activity may cause recurrence of her pain  ROS: See pertinent positives and negatives per HPI.  Past Medical History  Diagnosis Date  . Allergy   . Depression     No past surgical history on file.  Family History  Problem Relation Age of Onset  . Alcohol abuse Father   . Hyperlipidemia Father   . Heart disease Father 9  . Hypertension Father   . Cancer Maternal Grandmother     breast  . Cancer Maternal Grandfather     lung  . Diabetes Maternal Grandfather   . Arthritis Paternal Grandmother   . Diabetes Paternal Grandmother   . Mental illness Paternal Grandmother     Social History   Social History  . Marital Status: Married    Spouse Name: N/A  . Number of Children: N/A  . Years of Education: N/A   Social History Main Topics  . Smoking status: Never Smoker   . Smokeless tobacco: None  . Alcohol Use: Yes     Comment: socailly, 1 drink 4 nights per week  . Drug Use: No  . Sexual Activity: Not Asked   Other Topics Concern  . None   Social History Narrative   Work or School: homemaker      Home Situation: husband, 3 children      Spiritual Beliefs: Christian      Lifestyle: runs - 8 miles per week, diet is goood              Current outpatient prescriptions:  .  cetirizine (ZYRTEC) 10 MG  tablet, Take 10 mg by mouth daily., Disp: , Rfl:  .  Cholecalciferol (VITAMIN D PO), Take 200 Units by mouth daily., Disp: , Rfl:  .  fluticasone (FLONASE) 50 MCG/ACT nasal spray, Place 2 sprays into both nostrils daily., Disp: 16 g, Rfl: 3 .  sertraline (ZOLOFT) 50 MG tablet, Take 1 tablet (50 mg total) by mouth daily., Disp: 90 tablet, Rfl: 3  EXAM:  Filed Vitals:   12/17/15 0903  BP: 98/50  Pulse: 82  Temp: 98.1 F (36.7 C)    Body mass index is 20.68 kg/(m^2).  GENERAL: vitals reviewed and listed above, alert, oriented, appears well hydrated and in no acute distress  HEENT: atraumatic, conjunttiva clear, no obvious abnormalities on inspection of external nose and ears  NECK: no obvious masses on inspection  LUNGS: clear to auscultation bilaterally, no wheezes, rales or rhonchi, good air movement  CV: HRRR, no peripheral edema  MS: moves all extremities without noticeable abnormality  PSYCH: pleasant and cooperative, tearful at times  ASSESSMENT AND PLAN:  Discussed the following assessment and plan:  Moderate episode of recurrent major depressive disorder (HCC)  GAD (generalized anxiety disorder)  -discussed treatment options and risks -information provided for CBT with Dr. Jason Fila -opted to start Zoloft , advised she touch base in  4-6 weeks and if doing great would continue this dose, follow up in 4-6 weeks if not doing well, sooner if worsening -Patient advised to return or notify a doctor immediately if symptoms worsen or persist or new concerns arise.  There are no Patient Instructions on file for this visit.   Kriste BasqueKIM, HANNAH R.

## 2016-01-17 ENCOUNTER — Encounter: Payer: Self-pay | Admitting: Family Medicine

## 2016-01-17 ENCOUNTER — Other Ambulatory Visit: Payer: Self-pay | Admitting: *Deleted

## 2016-01-17 MED ORDER — SERTRALINE HCL 50 MG PO TABS: 50.0000 mg | ORAL_TABLET | Freq: Every day | ORAL | Status: DC

## 2016-09-07 ENCOUNTER — Other Ambulatory Visit: Payer: Self-pay | Admitting: Family Medicine

## 2017-02-21 ENCOUNTER — Other Ambulatory Visit: Payer: Self-pay | Admitting: Family Medicine

## 2017-02-23 NOTE — Telephone Encounter (Signed)
Denied.  Not seen since 12/2015.  Needs office visit.

## 2017-05-28 ENCOUNTER — Encounter: Payer: Self-pay | Admitting: Family Medicine

## 2017-06-01 ENCOUNTER — Encounter: Payer: Self-pay | Admitting: Family Medicine

## 2017-06-01 ENCOUNTER — Ambulatory Visit (INDEPENDENT_AMBULATORY_CARE_PROVIDER_SITE_OTHER): Payer: 59 | Admitting: Family Medicine

## 2017-06-01 DIAGNOSIS — M216X1 Other acquired deformities of right foot: Secondary | ICD-10-CM

## 2017-06-01 NOTE — Progress Notes (Signed)
[name] [address] [place], [place] [address] Phone: [phone] Subjective:     CC: Bilateral foot pain  [id number]  Holly Riddle is a 41 y.o. female coming in with complaint of bilateral foot pain. Patient has had this pain previously. Has been nearly 2 years since we seen her. Has responded well to custom orthotics. Started to increase running patient is noticing some soreness. Nothing that stops her. Patient has noticed some more soreness recently think some of it is a breakdown of the orthotics.     Past Medical History:  Diagnosis Date  . Allergy   . Depression    No past surgical history on file. Social History   Social History  . Marital status: Married    Spouse name: N/A  . Number of children: N/A  . Years of education: N/A   Social History Main Topics  . Smoking status: Never Smoker  . Smokeless tobacco: Not on file  . Alcohol use Yes     Comment: socailly, 1 drink 4 nights per week  . Drug use: No  . Sexual activity: Not on file   Other Topics Concern  . Not on file   Social History Narrative   Work or School: homemaker      Home Situation: husband, 3 children      Spiritual Beliefs: Christian      Lifestyle: runs - 8 miles per week, diet is goood            No Known Allergies Family History  Problem Relation Age of Onset  . Alcohol abuse Father   . Hyperlipidemia Father   . Heart disease Father 41  . Hypertension Father   . Cancer Maternal Grandmother        breast  . Cancer Maternal Grandfather        lung  . Diabetes Maternal Grandfather   . Arthritis Paternal Grandmother   . Diabetes Paternal Grandmother   . Mental illness Paternal Grandmother      Past medical history, social, surgical and family history all reviewed in electronic medical record.  No pertanent information unless stated regarding to the chief complaint.   Review of Systems:Review of systems updated and as accurate as of  06/01/17  No headache, visual changes, nausea, vomiting, diarrhea, constipation, dizziness, abdominal pain, skin rash, fevers, chills, night sweats, weight loss, swollen lymph nodes, body aches, joint swelling, , chest pain, shortness of breath, mood changes. Positive muscle aches  Objective  Blood pressure 120/80. Systems examined below as of 06/01/17   General: No apparent distress alert and oriented x3 mood and affect normal, dressed appropriately.  HEENT: Pupils equal, extraocular movements intact  Respiratory: Patient's speak in full sentences and does not appear short of breath  Cardiovascular: No lower extremity edema, non tender, no erythema  Skin: Warm dry intact with no signs of infection or rash on extremities or on axial skeleton.  Abdomen: Soft nontender  Neuro: Cranial nerves II through XII are intact, neurovascularly intact in all extremities with 2+ DTRs and 2+ pulses.  Lymph: No lymphadenopathy of posterior or anterior cervical chain or axillae bilaterally.  Gait normal with good balance and coordination.  MSK:  Non tender with full range of motion and good stability and symmetric strength and tone of shoulders, elbows, wrist, hip, knee and ankles bilaterally.  Foot exam shows the patient does have severe neural foot. Patient does have some mild overpronation with severe breakdown of the transverse  arch with some hammering of the fourth and fifth toes bilaterally right greater than left. Negative squeeze test. Some discomfort over the plantar aspect of the foot generalized though. Full range of motion of the ankles.   Impression and Recommendations:     This case required medical decision making of moderate complexity.      Note: This dictation was prepared with Dragon dictation along with smaller phrase technology. Any transcriptional errors that result from this process are unintentional.

## 2017-06-01 NOTE — Assessment & Plan Note (Signed)
Spent  25 minutes with patient face-to-face and had greater than 50% of counseling including as described in assessment and plan.Patient is been doing relatively well overall. We discussed with patient at the breakdown of the transverse arch. States icing regimen and home exercises. We discussed which activities to do which ones to avoid. Patient will continue the active. Avoid being barefoot. We will get her fitted with some orthotics. Patient will follow-up 2 weeks afterwards. We did make some adjustments to her other orthotics.

## 2017-06-01 NOTE — Patient Instructions (Signed)
Good to see you  Gustavus Bryant is your friend.  We will get you new orthotics as well.  We will call you We will make changes to your old pair as well.  Stay active When running need to eat within 30 minutes of working our with protein in it For the knee cross train little with biking.  See me again about 4 weeks after orthotics and lets see how you are doing.

## 2017-06-02 ENCOUNTER — Ambulatory Visit: Payer: 59 | Admitting: Family Medicine

## 2017-06-02 NOTE — Progress Notes (Signed)
Procedure Note   Patient was fitted for a : standard, cushioned, semi-rigid orthotic. The orthotic was heated and afterward the patient patient seated position and molded The patient was positioned in subtalar neutral position and 10 degrees of ankle dorsiflexion in a weight bearing stance. After completion of molding, patient did have orthotic management The blank was ground to a stable position for weight bearing.  Size: Women's 9 Base: Igli All Around  Additional Posting and Padding: Left:  Medial: 250/100x2, Transverse Arch: 250/35 Right: Medial: 250/100, Lateral 250/35, Transverse: 250/35x2 The patient ambulated these, and they were very comfortable.

## 2017-06-03 ENCOUNTER — Ambulatory Visit (INDEPENDENT_AMBULATORY_CARE_PROVIDER_SITE_OTHER): Payer: 59 | Admitting: Family Medicine

## 2017-06-03 ENCOUNTER — Encounter: Payer: Self-pay | Admitting: Family Medicine

## 2017-06-03 DIAGNOSIS — M216X1 Other acquired deformities of right foot: Secondary | ICD-10-CM

## 2017-06-03 NOTE — Assessment & Plan Note (Signed)
Fitted in custom orthotics today. Patient will start to increase wear over the course of next several weeks. We discussed if worsening symptoms to come back for any adjustments. Patient will otherwise follow-up in 4 weeks

## 2017-07-01 ENCOUNTER — Encounter: Payer: Self-pay | Admitting: Family Medicine

## 2017-07-01 ENCOUNTER — Ambulatory Visit (INDEPENDENT_AMBULATORY_CARE_PROVIDER_SITE_OTHER): Payer: 59 | Admitting: Family Medicine

## 2017-07-01 DIAGNOSIS — M216X1 Other acquired deformities of right foot: Secondary | ICD-10-CM

## 2017-07-01 NOTE — Assessment & Plan Note (Signed)
Doing well at this time. No significant changes. Patient was having mild heel pain is likely secondary to getting used to the new orthotics. We discussed a short-term use of a gel pad. Follow-up again as needed

## 2017-07-01 NOTE — Progress Notes (Signed)
[name] [address] [place], [place] [address] Phone: [phone] Subjective:    CC: Foot pain follow-up  BJY:[id number]  Devonda Pequignot is a 41 y.o. female coming in with complaint of foot pain. Patient was found to have loss of the transverse arch and what appeared to be more versus bursitis. Patient was placed in custom orthotics one month ago. Patient was to slowly increase the wear over the course of time. Patient states Overall seems to be doing relatively well. Patient states that only some heel pain. Patient states that the pain is a most completely gone. Increasing running to 20-30 miles a week.      Past Medical History:  Diagnosis Date  . Allergy   . Depression    No past surgical history on file. Social History   Social History  . Marital status: Married    Spouse name: N/A  . Number of children: N/A  . Years of education: N/A   Social History Main Topics  . Smoking status: Never Smoker  . Smokeless tobacco: Never Used  . Alcohol use Yes     Comment: socailly, 1 drink 4 nights per week  . Drug use: No  . Sexual activity: Not Asked   Other Topics Concern  . None   Social History Narrative   Work or School: homemaker      Home Situation: husband, 3 children      Spiritual Beliefs: Christian      Lifestyle: runs - 8 miles per week, diet is goood            No Known Allergies Family History  Problem Relation Age of Onset  . Alcohol abuse Father   . Hyperlipidemia Father   . Heart disease Father 53  . Hypertension Father   . Cancer Maternal Grandmother        breast  . Cancer Maternal Grandfather        lung  . Diabetes Maternal Grandfather   . Arthritis Paternal Grandmother   . Diabetes Paternal Grandmother   . Mental illness Paternal Grandmother      Past medical history, social, surgical and family history all reviewed in electronic medical record.  No pertanent information unless stated regarding to  the chief complaint.   Review of Systems:Review of systems updated and as accurate as of 07/01/17  No headache, visual changes, nausea, vomiting, diarrhea, constipation, dizziness, abdominal pain, skin rash, fevers, chills, night sweats, weight loss, swollen lymph nodes, body aches, joint swelling, chest pain, shortness of breath, mood changes. Positive muscle aches  Objective  Blood pressure 116/70, pulse 80, height 5\' 7"  (1.702 m), weight 131 lb (59.4 kg), SpO2 99 %. Systems examined below as of 07/01/17   General: No apparent distress alert and oriented x3 mood and affect normal, dressed appropriately.  HEENT: Pupils equal, extraocular movements intact  Respiratory: Patient's speak in full sentences and does not appear short of breath  Cardiovascular: No lower extremity edema, non tender, no erythema  Skin: Warm dry intact with no signs of infection or rash on extremities or on axial skeleton.  Abdomen: Soft nontender  Neuro: Cranial nerves II through XII are intact, neurovascularly intact in all extremities with 2+ DTRs and 2+ pulses.  Lymph: No lymphadenopathy of posterior or anterior cervical chain or axillae bilaterally.  Gait normal with good balance and coordination.  MSK:  Non tender with full range of motion and good stability and symmetric strength and tone of shoulders,  elbows, wrist, hip, knee and ankles bilaterally.  Foot exam shows the patient does have breakdown of the transverse arch but is nontender over the sesamoid bones. Patient has a negative squeeze test. No pain over the posterior portion of the foot.    Impression and Recommendations:     This case required medical decision making of moderate complexity.      Note: This dictation was prepared with Dragon dictation along with smaller phrase technology. Any transcriptional errors that result from this process are unintentional.

## 2017-07-01 NOTE — Patient Instructions (Signed)
Good to see you  Holly Riddle is your friend.  Stay active.  Heel gel pad try first, if not helping then send a message and we can adjust the orthotic.  As long as you do well  See me when you need me!

## 2017-09-17 ENCOUNTER — Encounter: Payer: Self-pay | Admitting: Family Medicine

## 2017-12-29 ENCOUNTER — Encounter: Payer: Self-pay | Admitting: Family Medicine

## 2017-12-29 ENCOUNTER — Ambulatory Visit (INDEPENDENT_AMBULATORY_CARE_PROVIDER_SITE_OTHER): Payer: 59 | Admitting: Family Medicine

## 2017-12-29 VITALS — BP 90/60 | HR 73 | Temp 98.0°F | Ht 67.0 in | Wt 134.7 lb

## 2017-12-29 DIAGNOSIS — F3342 Major depressive disorder, recurrent, in full remission: Secondary | ICD-10-CM | POA: Diagnosis not present

## 2017-12-29 DIAGNOSIS — F419 Anxiety disorder, unspecified: Secondary | ICD-10-CM | POA: Diagnosis not present

## 2017-12-29 MED ORDER — SERTRALINE HCL 50 MG PO TABS: 50.0000 mg | ORAL_TABLET | Freq: Every day | ORAL | 3 refills | Status: DC

## 2017-12-29 NOTE — Progress Notes (Signed)
  HPI:  Using dictation device. Unfortunately this device frequently misinterprets words/phrases.   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  Past Medical History:  Diagnosis Date  . Allergy   . Depression     History reviewed. No pertinent surgical history.  Family History  Problem Relation Age of Onset  . Alcohol abuse Father   . Hyperlipidemia Father   . Heart disease Father [id number]  . Hypertension Father   . Cancer Maternal Grandmother        breast  . Cancer Maternal Grandfather        lung  . Diabetes Maternal Grandfather   . Arthritis Paternal Grandmother   . Diabetes Paternal Grandmother   . Mental illness Paternal Grandmother     SOCIAL HX: See above   Current Outpatient Medications:  .  cetirizine (ZYRTEC) 10 MG tablet, Take 10 mg by mouth daily., Disp: , Rfl:  .  Cholecalciferol (VITAMIN D PO), Take 200 Units by mouth daily., Disp: , Rfl:  .  fluticasone (FLONASE) 50 MCG/ACT nasal spray, Place 2 sprays into both nostrils daily., Disp: 16 g, Rfl: 3 .  sertraline (ZOLOFT) 50 MG tablet, Take 1 tablet (50 mg total) by mouth daily., Disp: 90 tablet, Rfl: 3  EXAM:  Vitals:   12/29/17 0859  BP: 90/60  Pulse: 73  Temp: 98 F (36.7 C)    Body mass index is 21.1 kg/m.  GENERAL: vitals reviewed and listed above, alert, oriented, appears well hydrated and in no acute distress  HEENT: atraumatic, conjunttiva clear, no obvious abnormalities on inspection of external nose and ears  NECK: no obvious masses on  inspection  LUNGS: clear to auscultation bilaterally, no wheezes, rales or rhonchi, good air movement  CV: HRRR, no peripheral edema  MS: moves all extremities without noticeable abnormality  PSYCH: pleasant and cooperative, no obvious depression or anxiety  ASSESSMENT AND PLAN:  Discussed the following assessment and plan:  Recurrent major depressive disorder, in full remission (HCC)  Anxiety  -Doing well back on Zoloft, refill sent -Advised follow-up in 6-12 months, perhaps physical for lab check -Follow-up in the interim as needed  Declined AVS.  There are no Patient Instructions on file for this visit.  Terressa KoyanagiHannah R Kehinde Bowdish, DO

## 2018-03-24 NOTE — Progress Notes (Signed)
[name] [address] [place], [place] [address] Phone: [phone] Subjective:     CC: Right hip pain  ZHY:[id number]  Holly Riddle is a 42 y.o. female coming in with complaint of right hip pain. Feels that she injured her hip running back in May. She has tried stretching and strengthening. Pain in the hamstring with running but after running she is having pain in the hip flexor into the GT area. Pain originially started in the anterior hip. Pain radiates now down to the knee. Has modified workouts to help with pain. Has also tried ice and IBU.        Past Medical History:  Diagnosis Date  . Allergy   . Depression    No past surgical history on file. Social History   Socioeconomic History  . Marital status: Married    Spouse name: Not on file  . Number of children: Not on file  . Years of education: Not on file  . Highest education level: Not on file  Occupational History  . Not on file  Social Needs  . Financial resource strain: Not on file  . Food insecurity:    Worry: Not on file    Inability: Not on file  . Transportation needs:    Medical: Not on file    Non-medical: Not on file  Tobacco Use  . Smoking status: Never Smoker  . Smokeless tobacco: Never Used  Substance and Sexual Activity  . Alcohol use: Yes    Comment: socailly, 1 drink 4 nights per week  . Drug use: No  . Sexual activity: Not on file  Lifestyle  . Physical activity:    Days per week: Not on file    Minutes per session: Not on file  . Stress: Not on file  Relationships  . Social connections:    Talks on phone: Not on file    Gets together: Not on file    Attends religious service: Not on file    Active member of club or organization: Not on file    Attends meetings of clubs or organizations: Not on file    Relationship status: Not on file  Other Topics Concern  . Not on file  Social History Narrative   Work or School: homemaker      Home  Situation: husband, 3 children      Spiritual Beliefs: Christian      Lifestyle: runs - 8 miles per week, diet is goood            No Known Allergies Family History  Problem Relation Age of Onset  . Alcohol abuse Father   . Hyperlipidemia Father   . Heart disease Father 62  . Hypertension Father   . Cancer Maternal Grandmother        breast  . Cancer Maternal Grandfather        lung  . Diabetes Maternal Grandfather   . Arthritis Paternal Grandmother   . Diabetes Paternal Grandmother   . Mental illness Paternal Grandmother      Past medical history, social, surgical and family history all reviewed in electronic medical record.  No pertanent information unless stated regarding to the chief complaint.   Review of Systems:Review of systems updated and as accurate as of 03/26/18  No headache, visual changes, nausea, vomiting, diarrhea, constipation, dizziness, abdominal pain, skin rash, fevers, chills, night sweats, weight loss, swollen lymph nodes, body aches, joint swelling, chest pain, shortness of breath, mood  changes.  Positive muscle aches  Objective  Blood pressure 120/72, pulse 85, height 5\' 7"  (1.702 m), weight 133 lb (60.3 kg), SpO2 98 %. Systems examined below as of 03/26/18   General: No apparent distress alert and oriented x3 mood and affect normal, dressed appropriately.  HEENT: Pupils equal, extraocular movements intact  Respiratory: Patient's speak in full sentences and does not appear short of breath  Cardiovascular: No lower extremity edema, non tender, no erythema  Skin: Warm dry intact with no signs of infection or rash on extremities or on axial skeleton.  Abdomen: Soft nontender  Neuro: Cranial nerves II through XII are intact, neurovascularly intact in all extremities with 2+ DTRs and 2+ pulses.  Lymph: No lymphadenopathy of posterior or anterior cervical chain or axillae bilaterally.  Gait normal with good balance and coordination.  MSK:  Non tender  with full range of motion and good stability and symmetric strength and tone of shoulders, elbows, wrist,  knee and ankles bilaterally.  Hip: Right ROM IR: 25 Deg, ER: 45 Deg, Flexion: 120 Deg, Extension: 100 Deg, Abduction: 45 Deg, Adduction: 45 Deg Strength IR: 5/5, ER: 5/5, Flexion: 5/5, Extension: 5/5, Abduction: 5/5, Adduction: 5/5 Pelvic alignment unremarkable to inspection and palpation. Standing hip rotation and gait without trendelenburg sign / unsteadiness. Greater trochanter without tenderness to palpation. No tenderness over piriformis and greater trochanter. Positive Faber bilaterally noted. No SI joint tenderness and normal minimal SI movement. Significant tightness noted of the hip flexor on the right side compared to the left  Osteopathic findings  T6 extended rotated and side bent left L1 flexed rotated and side bent right Sacrum right on right    Impression and Recommendations:     This case required medical decision making of moderate complexity.      Note: This dictation was prepared with Dragon dictation along with smaller phrase technology. Any transcriptional errors that result from this process are unintentional.

## 2018-03-26 ENCOUNTER — Encounter: Payer: Self-pay | Admitting: Family Medicine

## 2018-03-26 ENCOUNTER — Ambulatory Visit (INDEPENDENT_AMBULATORY_CARE_PROVIDER_SITE_OTHER): Payer: Managed Care, Other (non HMO) | Admitting: Family Medicine

## 2018-03-26 ENCOUNTER — Ambulatory Visit (INDEPENDENT_AMBULATORY_CARE_PROVIDER_SITE_OTHER)
Admission: RE | Admit: 2018-03-26 | Discharge: 2018-03-26 | Disposition: A | Discharge status: Home / Self Care | Payer: Managed Care, Other (non HMO) | Source: Ambulatory Visit | Attending: Family Medicine | Admitting: Family Medicine

## 2018-03-26 VITALS — BP 120/72 | HR 85 | Ht 67.0 in | Wt 133.0 lb

## 2018-03-26 DIAGNOSIS — M999 Biomechanical lesion, unspecified: Secondary | ICD-10-CM | POA: Diagnosis not present

## 2018-03-26 DIAGNOSIS — M25551 Pain in right hip: Secondary | ICD-10-CM

## 2018-03-26 NOTE — Assessment & Plan Note (Signed)
Decision today to treat with OMT was based on Physical Exam  After verbal consent patient was treated with HVLA, ME, FPR techniques in , thoracic, lumbar and sacral areas  Patient tolerated the procedure well with improvement in symptoms  Patient given exercises, stretches and lifestyle modifications  See medications in patient instructions if given  Patient will follow up in 4 weeks 

## 2018-03-26 NOTE — Assessment & Plan Note (Signed)
Differential is broad.  Possible anterior impingement syndrome and patient will have x-rays.  Hip flexor tightness causing a mild tendinitis could be within the differential as well but not as significant pain with testing today.  Discussed with patient about alignment proper running efficiency.  Attempted osteopathic manipulation today.  Home exercises given, discussed topical anti-inflammatories.  Discussed avoiding certain strenuous activity at the moment and follow-up again in 3 to 4 weeks

## 2018-03-26 NOTE — Patient Instructions (Signed)
Great to see you  We tried manipulation and hope it helps Ice is your friend. Ice 20 minutes 2 times daily. Usually after activity and before bed. Stay active but run only 1-2 times a week  Biking and elliptical would be good.  Xrays downstairs See me again In 3-4 weeks

## 2018-04-21 NOTE — Progress Notes (Signed)
[facility] D[facility] [address] [place], [place] [address] Phone: [phone] Subjective:     CC: Hip and back pain follow-up  BJY:[id number]:Subjective  Clearnce HastenKristen Riddle is a 42 y.o. female coming in with complaint of right hip pain. States that she feel painful today. States the pain is radiating into her lower back. Has been exercising. Patient has been trying to run.  Has significant tightness of the hip flexors.  States that the osteopathic manipulation last time did not make a significant improvement.  Patient wants to be active but is trying to figure out what else she can do to stay active.     Past Medical History:  Diagnosis Date  . Allergy   . Depression    No past surgical history on file. Social History   Socioeconomic History  . Marital status: Married    Spouse name: Not on file  . Number of children: Not on file  . Years of education: Not on file  . Highest education level: Not on file  Occupational History  . Not on file  Social Needs  . Financial resource strain: Not on file  . Food insecurity:    Worry: Not on file    Inability: Not on file  . Transportation needs:    Medical: Not on file    Non-medical: Not on file  Tobacco Use  . Smoking status: Never Smoker  . Smokeless tobacco: Never Used  Substance and Sexual Activity  . Alcohol use: Yes    Comment: socailly, 1 drink 4 nights per week  . Drug use: No  . Sexual activity: Not on file  Lifestyle  . Physical activity:    Days per week: Not on file    Minutes per session: Not on file  . Stress: Not on file  Relationships  . Social connections:    Talks on phone: Not on file    Gets together: Not on file    Attends religious service: Not on file    Active member of club or organization: Not on file    Attends meetings of clubs or organizations: Not on file    Relationship status: Not on file  Other Topics Concern  . Not on file  Social History Narrative   Work or School:  homemaker      Home Situation: husband, 3 children      Spiritual Beliefs: Christian      Lifestyle: runs - 8 miles per week, diet is goood            No Known Allergies Family History  Problem Relation Age of Onset  . Alcohol abuse Father   . Hyperlipidemia Father   . Heart disease Father [id number]  . Hypertension Father   . Cancer Maternal Grandmother        breast  . Cancer Maternal Grandfather        lung  . Diabetes Maternal Grandfather   . Arthritis Paternal Grandmother   . Diabetes Paternal Grandmother   . Mental illness Paternal Grandmother      Past medical history, social, surgical and family history all reviewed in electronic medical record.  No pertanent information unless stated regarding to the chief complaint.   Review of Systems:Review of systems updated and as accurate as of 04/22/18  No headache, visual changes, nausea, vomiting, diarrhea, constipation, dizziness, abdominal pain, skin rash, fevers, chills, night sweats, weight loss, swollen lymph nodes, body aches, joint swelling,  chest pain, shortness of  breath, mood changes.  Positive muscle aches  Objective  Blood pressure 100/78, pulse 68, height 5\' 7"  (1.702 m), weight 135 lb (61.2 kg), SpO2 98 %. Systems examined below as of 04/22/18   General: No apparent distress alert and oriented x3 mood and affect normal, dressed appropriately.  HEENT: Pupils equal, extraocular movements intact  Respiratory: Patient's speak in full sentences and does not appear short of breath  Cardiovascular: No lower extremity edema, non tender, no erythema  Skin: Warm dry intact with no signs of infection or rash on extremities or on axial skeleton.  Abdomen: Soft nontender  Neuro: Cranial nerves II through XII are intact, neurovascularly intact in all extremities with 2+ DTRs and 2+ pulses.  Lymph: No lymphadenopathy of posterior or anterior cervical chain or axillae bilaterally.  Gait normal with good balance and coordination.   MSK:  Non tender with full range of motion and good stability and symmetric strength and tone of shoulders, elbows, wrist, hip, knee and ankles bilaterally.   Back Exam:  Inspection: Unremarkable  Motion: Flexion 45 deg, Extension 25 deg, Side Bending to 35 deg bilaterally,  Rotation to 45 deg bilaterally  SLR laying: Negative  XSLR laying: Negative  Palpable tenderness: Tender to palpation the paraspinal musculature lumbar spine right greater than left. FABER: Positive Faber bilaterally. Sensory change: Gross sensation intact to all lumbar and sacral dermatomes.  Reflexes: 2+ at both patellar tendons, 2+ at achilles tendons, Babinski's downgoing.  Strength at foot  Plantar-flexion: 5/5 Dorsi-flexion: 5/5 Eversion: 5/5 Inversion: 5/5  Leg strength  Quad: 5/5 Hamstring: 5/5 Hip flexor: 5/5 Hip abductors: 4/5 but symmetric Gait unremarkable.  Osteopathic findings C2 flexed rotated and side bent right T3 extended rotated and side bent right inhaled third rib T9 extended rotated and side bent left L2 flexed rotated and side bent right Sacrum right on right     Impression and Recommendations:     This case required medical decision making of moderate complexity.      Note: This dictation was prepared with Dragon dictation along with smaller phrase technology. Any transcriptional errors that result from this process are unintentional.

## 2018-04-22 ENCOUNTER — Encounter: Payer: Self-pay | Admitting: Family Medicine

## 2018-04-22 ENCOUNTER — Ambulatory Visit: Payer: Managed Care, Other (non HMO) | Admitting: Family Medicine

## 2018-04-22 VITALS — BP 100/78 | HR 68 | Ht 67.0 in | Wt 135.0 lb

## 2018-04-22 DIAGNOSIS — M24551 Contracture, right hip: Secondary | ICD-10-CM | POA: Insufficient documentation

## 2018-04-22 DIAGNOSIS — M25551 Pain in right hip: Secondary | ICD-10-CM

## 2018-04-22 DIAGNOSIS — M999 Biomechanical lesion, unspecified: Secondary | ICD-10-CM | POA: Insufficient documentation

## 2018-04-22 NOTE — Assessment & Plan Note (Signed)
To flexor tightness.  Attempted osteopathic manipulation.  Sent to formal physical therapy.  Not making significant amount of improvement.  X-rays previously were unremarkable.  Continues to have pain consider possible MRI.

## 2018-04-22 NOTE — Patient Instructions (Signed)
Good to see you  PT will be calling you  Keep active and ok to run 1-2 times a week  Keep up the exercises Duxis 3 times a day for 3 days  See me again in 4-5 weeks

## 2018-04-22 NOTE — Assessment & Plan Note (Signed)
Decision today to treat with OMT was based on Physical Exam  After verbal consent patient was treated with HVLA, ME, FPR techniques in cervical, thoracic, lumbar and sacral areas  Patient tolerated the procedure well with improvement in symptoms  Patient given exercises, stretches and lifestyle modifications  See medications in patient instructions if given  Patient will follow up in 4 weeks 

## 2018-05-03 ENCOUNTER — Encounter: Payer: Self-pay | Admitting: Family Medicine

## 2018-05-27 ENCOUNTER — Ambulatory Visit: Payer: Managed Care, Other (non HMO) | Admitting: Family Medicine

## 2018-12-27 ENCOUNTER — Other Ambulatory Visit: Payer: Self-pay | Admitting: Family Medicine

## 2018-12-30 ENCOUNTER — Encounter: Payer: Self-pay | Admitting: Family Medicine

## 2018-12-30 ENCOUNTER — Other Ambulatory Visit: Payer: Self-pay

## 2018-12-30 ENCOUNTER — Ambulatory Visit (INDEPENDENT_AMBULATORY_CARE_PROVIDER_SITE_OTHER): Payer: Managed Care, Other (non HMO) | Admitting: Family Medicine

## 2018-12-30 VITALS — Wt 135.0 lb

## 2018-12-30 DIAGNOSIS — F411 Generalized anxiety disorder: Secondary | ICD-10-CM | POA: Diagnosis not present

## 2018-12-30 DIAGNOSIS — F325 Major depressive disorder, single episode, in full remission: Secondary | ICD-10-CM | POA: Diagnosis not present

## 2018-12-30 MED ORDER — SERTRALINE HCL 50 MG PO TABS: 50.0000 mg | ORAL_TABLET | Freq: Every day | ORAL | 3 refills | Status: DC

## 2018-12-30 NOTE — Progress Notes (Signed)
Virtual Visit via Video Note  I connected with [employer]  on 12/30/18 at  3:00 PM EDT by a video enabled telemedicine application and verified that I am speaking with the correct person using two identifiers.  Location patient: home Location provider:work or home office Persons participating in the virtual visit: patient, provider  I discussed the limitations of evaluation and management by telemedicine and the availability of in person appointments. The patient expressed understanding and agreed to proceed.   HPI:  Follow up Depression: -reports has been doing fairly well -some anxiety with changes at work and homeschooling her children in the COVID19 pandemic -but, overall feels is coping and doing well -would like to continue the zoloft and requests refills -currently does not feel needs CBT -no severe depression, see PHQ9  ROS: See pertinent positives and negatives per HPI.  Past Medical History:  Diagnosis Date  . Allergy   . Depression     No past surgical history on file.  Family History  Problem Relation Age of Onset  . Alcohol abuse Father   . Hyperlipidemia Father   . Heart disease Father 52  . Hypertension Father   . Cancer Maternal Grandmother        breast  . Cancer Maternal Grandfather        lung  . Diabetes Maternal Grandfather   . Arthritis Paternal Grandmother   . Diabetes Paternal Grandmother   . Mental illness Paternal Grandmother     SOCIAL HX: see hpi   Current Outpatient Medications:  .  cetirizine (ZYRTEC) 10 MG tablet, Take 10 mg by mouth daily., Disp: , Rfl:  .  Cholecalciferol (VITAMIN D PO), Take 200 Units by mouth daily., Disp: , Rfl:  .  fluticasone (FLONASE) 50 MCG/ACT nasal spray, Place 2 sprays into both nostrils daily., Disp: 16 g, Rfl: 3 .  sertraline (ZOLOFT) 50 MG tablet, Take 1 tablet (50 mg total) by mouth daily., Disp: 90 tablet, Rfl: 3  EXAM:  VITALS per patient if applicable:none  GENERAL: alert, oriented, appears  well and in no acute distress  HEENT: atraumatic, conjunttiva clear, no obvious abnormalities on inspection of external nose and ears  NECK: normal movements of the head and neck  LUNGS: on inspection no signs of respiratory distress, breathing rate appears normal, no obvious gross SOB, gasping or wheezing  CV: no obvious cyanosis  MS: moves all visible extremities without noticeable abnormality  PSYCH/NEURO: pleasant and cooperative, no obvious depression or anxiety, speech and thought processing grossly intact  ASSESSMENT AND PLAN:  Discussed the following assessment and plan:  GAD (generalized anxiety disorder)  Major depressive disorder in full remission, unspecified whether recurrent (HCC)  Doing well. Refills provided. Made her aware of virtual counseling avialable during pandemic. TOC/CPE in 3-4 months with Dr. Hassan Rowan.   I discussed the assessment and treatment plan with the patient. The patient was provided an opportunity to ask questions and all were answered. The patient agreed with the plan and demonstrated an understanding of the instructions.   The patient was advised to call back or seek an in-person evaluation if the symptoms worsen or if the condition fails to improve as anticipated.   Follow up instructions: Advised assistant Ronnald Collum to help patient arrange the following: -please complete PHQ9 and update by phone -TOC with Dr. Hassan Rowan in 3 months, pt would like CPE then if possible   Terressa Koyanagi, DO

## 2019-03-09 HISTORY — PX: ABLATION: SHX5711

## 2019-03-28 ENCOUNTER — Encounter: Payer: Self-pay | Admitting: Family Medicine

## 2019-04-01 ENCOUNTER — Ambulatory Visit: Payer: Managed Care, Other (non HMO) | Admitting: Family Medicine

## 2019-04-01 ENCOUNTER — Telehealth: Payer: Self-pay | Admitting: *Deleted

## 2019-04-01 ENCOUNTER — Ambulatory Visit (INDEPENDENT_AMBULATORY_CARE_PROVIDER_SITE_OTHER): Payer: Managed Care, Other (non HMO) | Admitting: Family Medicine

## 2019-04-01 ENCOUNTER — Encounter: Payer: Self-pay | Admitting: Family Medicine

## 2019-04-01 ENCOUNTER — Other Ambulatory Visit: Payer: Self-pay

## 2019-04-01 VITALS — BP 100/58 | HR 64 | Temp 98.1°F | Ht 67.0 in | Wt 137.1 lb

## 2019-04-01 DIAGNOSIS — Z0000 Encounter for general adult medical examination without abnormal findings: Secondary | ICD-10-CM | POA: Diagnosis not present

## 2019-04-01 DIAGNOSIS — Z23 Encounter for immunization: Secondary | ICD-10-CM

## 2019-04-01 DIAGNOSIS — Z13220 Encounter for screening for lipoid disorders: Secondary | ICD-10-CM | POA: Diagnosis not present

## 2019-04-01 DIAGNOSIS — Z131 Encounter for screening for diabetes mellitus: Secondary | ICD-10-CM

## 2019-04-01 DIAGNOSIS — N924 Excessive bleeding in the premenopausal period: Secondary | ICD-10-CM | POA: Diagnosis not present

## 2019-04-01 NOTE — Progress Notes (Signed)
Patient needed in office physical; note was started but deleted after patient moved to open afternoon slot where in person visit could be completed.

## 2019-04-01 NOTE — Telephone Encounter (Signed)
Appt scheduled for today at 2pm

## 2019-04-01 NOTE — Telephone Encounter (Signed)
-----   Message from Caren Macadam, MD sent at 04/01/2019  9:36 AM EDT ----- Patient needed in office physical. Can you put her in my 2 pm slot? I'm not sure why this is blocked anyway? That way I can do her hands on physical and she can get bloodwork done.

## 2019-04-01 NOTE — Progress Notes (Signed)
Holly Riddle DOB: 08/11/1976 Encounter date: 04/01/2019  This is a 43 y.o. female who presents to establish care. Chief Complaint  Patient presents with  . Establish Care    History of present illness: No specific concerns that she has now; just needed to get physical for insurance.   Anxiety/Depression: zoloft - doing ok for mood overall.   Allergies: flonase, zyrtec. Feels fairly well controlled with these.   Had ablation recently. Polyps, severe cramping and heavy bleeding. Recovering well from this.   Had one mammogram a few years ago - they discussed repeating at next annual with gynecologist.   Had dermatologist but hasn't been in a few years. No current skin concerns  Concentration is issue with working at home.     Past Medical History:  Diagnosis Date  . Allergy   . Depression    Past Surgical History:  Procedure Laterality Date  . ABLATION  03/2019   No Known Allergies Current Meds  Medication Sig  . cetirizine (ZYRTEC) 10 MG tablet Take 10 mg by mouth daily.  . fluticasone (FLONASE) 50 MCG/ACT nasal spray Place 2 sprays into both nostrils daily.  . sertraline (ZOLOFT) 50 MG tablet Take 1 tablet (50 mg total) by mouth daily.   Social History   Tobacco Use  . Smoking status: Never Smoker  . Smokeless tobacco: Never Used  Substance Use Topics  . Alcohol use: Yes    Alcohol/week: 1.0 standard drinks    Types: 1 Glasses of wine per week   Family History  Problem Relation Age of Onset  . Alcohol abuse Father   . Hyperlipidemia Father   . Heart disease Father 46  . Hypertension Father   . Cancer Maternal Grandmother 30       breast  . Cancer Maternal Grandfather        lung  . Diabetes Maternal Grandfather   . Arthritis Paternal Grandmother   . Diabetes Paternal Grandmother   . Mental illness Paternal Grandmother   . Healthy Brother      Review of Systems  Constitutional: Negative for chills and fever. Malaise/fatigue: has had some fatigue  with heavy menses/hormonal changes. coming around after ablation.  HENT: Negative for congestion, ear discharge, ear pain, hearing loss and sore throat.   Eyes: Negative for blurred vision, pain, discharge and redness.  Respiratory: Negative for cough, shortness of breath and wheezing.   Cardiovascular: Negative for chest pain, palpitations (some heart racing with anxiety) and leg swelling.  Gastrointestinal: Negative for constipation, diarrhea and heartburn.  Genitourinary: Negative for dysuria.  Musculoskeletal: Negative for joint pain and myalgias.  Neurological: Positive for headaches (chronic, mild. usually 2/week go away with single dose ibuprofen). Negative for dizziness.  Psychiatric/Behavioral: Negative for depression. The patient is not nervous/anxious.      Objective:  There were no vitals taken for this visit.      BP Readings from Last 3 Encounters:  04/22/18 100/78  03/26/18 120/72  12/29/17 90/60   Wt Readings from Last 3 Encounters:  12/30/18 135 lb (61.2 kg)  04/22/18 135 lb (61.2 kg)  03/26/18 133 lb (60.3 kg)    Physical Exam Constitutional:      General: She is not in acute distress.    Appearance: She is well-developed.  HENT:     Head: Normocephalic and atraumatic.     Right Ear: External ear normal.     Left Ear: External ear normal.     Mouth/Throat:     Pharynx: No  oropharyngeal exudate.  Eyes:     Conjunctiva/sclera: Conjunctivae normal.     Pupils: Pupils are equal, round, and reactive to light.  Neck:     Musculoskeletal: Normal range of motion and neck supple.     Thyroid: No thyromegaly.  Cardiovascular:     Rate and Rhythm: Normal rate and regular rhythm.     Heart sounds: Normal heart sounds. No murmur. No friction rub. No gallop.   Pulmonary:     Effort: Pulmonary effort is normal.     Breath sounds: Normal breath sounds.  Abdominal:     General: Bowel sounds are normal. There is no distension.     Palpations: Abdomen is soft. There  is no mass.     Tenderness: There is no abdominal tenderness. There is no guarding.     Hernia: No hernia is present.  Musculoskeletal: Normal range of motion.        General: No tenderness or deformity.  Lymphadenopathy:     Cervical: No cervical adenopathy.  Skin:    General: Skin is warm and dry.     Findings: No rash.  Neurological:     Mental Status: She is alert and oriented to person, place, and time.     Deep Tendon Reflexes: Reflexes normal.     Reflex Scores:      Tricep reflexes are 2+ on the right side and 2+ on the left side.      Bicep reflexes are 2+ on the right side and 2+ on the left side.      Brachioradialis reflexes are 2+ on the right side and 2+ on the left side.      Patellar reflexes are 2+ on the right side and 2+ on the left side. Psychiatric:        Speech: Speech normal.        Behavior: Behavior normal.        Thought Content: Thought content normal.    Depression screen [facility] 2/9 04/01/2019 12/31/2018 12/30/2018  Decreased Interest 1 0 0  Down, Depressed, Hopeless 1 0 0  PHQ - 2 Score 2 0 0  Altered sleeping 0 - -  Tired, decreased energy 0 - -  Change in appetite 1 - -  Feeling bad or failure about yourself  0 - -  Trouble concentrating 3 - -  Moving slowly or fidgety/restless 0 - -  Suicidal thoughts 0 - -  PHQ-9 Score 6 - -  Difficult doing work/chores Not difficult at all - -   Feels anxiety/mood related to COVID/everything going on in world/country currently.  Assessment/Plan: 1. Preventative health care Keep up with healthy and active lifestyle.  Continue regular exercise.  2. Need for Tdap vaccination Completed today in the office. - Tdap vaccine greater than or equal to 7yo IM; Future  3. Lipid screening - Lipid panel; Future - Lipid panel  4. Excessive bleeding in premenopausal period Has done well since ablation.   - CBC with Differential/Platelet; Future - CBC with Differential/Platelet  5. Screening for diabetes  mellitus - Comprehensive metabolic panel; Future - Comprehensive metabolic panel - Hemoglobin A1c  We do have paperwork to complete for her health screening once her blood work results have returned.  No follow-ups on file.  Theodis ShoveJunell Koberlein, MD

## 2019-04-02 LAB — CBC WITH DIFFERENTIAL/PLATELET
Absolute Monocytes: 465 cells/uL (ref 200–950)
Basophils Absolute: 28 cells/uL (ref 0–200)
Basophils Relative: 0.5 %
Eosinophils Absolute: 123 cells/uL (ref 15–500)
Eosinophils Relative: 2.2 %
HCT: 41.2 % (ref 35.0–45.0)
Hemoglobin: 14 g/dL (ref 11.7–15.5)
Lymphs Abs: 1579 cells/uL (ref 850–3900)
MCH: 31.2 pg (ref 27.0–33.0)
MCHC: 34 g/dL (ref 32.0–36.0)
MCV: 91.8 fL (ref 80.0–100.0)
MPV: 9.7 fL (ref 7.5–12.5)
Monocytes Relative: 8.3 %
Neutro Abs: 3405 cells/uL (ref 1500–7800)
Neutrophils Relative %: 60.8 %
Platelets: 264 10*3/uL (ref 140–400)
RBC: 4.49 10*6/uL (ref 3.80–5.10)
RDW: 11.8 % (ref 11.0–15.0)
Total Lymphocyte: 28.2 %
WBC: 5.6 10*3/uL (ref 3.8–10.8)

## 2019-04-02 LAB — COMPREHENSIVE METABOLIC PANEL
AG Ratio: 1.9 (calc) (ref 1.0–2.5)
ALT: 8 U/L (ref 6–29)
AST: 14 U/L (ref 10–30)
Albumin: 4.2 g/dL (ref 3.6–5.1)
Alkaline phosphatase (APISO): 67 U/L (ref 31–125)
BUN: 14 mg/dL (ref 7–25)
CO2: 26 mmol/L (ref 20–32)
Calcium: 9.6 mg/dL (ref 8.6–10.2)
Chloride: 104 mmol/L (ref 98–110)
Creat: 0.91 mg/dL (ref 0.50–1.10)
Globulin: 2.2 g/dL (calc) (ref 1.9–3.7)
Glucose, Bld: 107 mg/dL — ABNORMAL HIGH (ref 65–99)
Potassium: 4.4 mmol/L (ref 3.5–5.3)
Sodium: 140 mmol/L (ref 135–146)
Total Bilirubin: 0.3 mg/dL (ref 0.2–1.2)
Total Protein: 6.4 g/dL (ref 6.1–8.1)

## 2019-04-02 LAB — HEMOGLOBIN A1C
Hgb A1c MFr Bld: 5.1 % of total Hgb (ref <5.7)
Mean Plasma Glucose: 100 (calc)
eAG (mmol/L): 5.5 (calc)

## 2019-04-02 LAB — LIPID PANEL
Cholesterol: 162 mg/dL (ref <200)
HDL: 49 mg/dL — ABNORMAL LOW (ref >=50)
LDL Cholesterol (Calc): 83 mg/dL (calc)
Non-HDL Cholesterol (Calc): 113 mg/dL (calc) (ref <130)
Total CHOL/HDL Ratio: 3.3 (calc) (ref <5.0)
Triglycerides: 206 mg/dL — ABNORMAL HIGH (ref <150)

## 2019-08-12 ENCOUNTER — Other Ambulatory Visit: Payer: Self-pay | Admitting: Cardiology

## 2019-08-12 DIAGNOSIS — Z20822 Contact with and (suspected) exposure to covid-19: Secondary | ICD-10-CM

## 2019-08-13 LAB — NOVEL CORONAVIRUS, NAA: SARS-CoV-2, NAA: NOT DETECTED

## 2020-02-16 ENCOUNTER — Other Ambulatory Visit: Payer: Self-pay

## 2020-02-16 ENCOUNTER — Encounter: Payer: Self-pay | Admitting: Family Medicine

## 2020-02-16 ENCOUNTER — Ambulatory Visit: Payer: Managed Care, Other (non HMO) | Admitting: Family Medicine

## 2020-02-16 VITALS — BP 104/72 | HR 52 | Ht 67.0 in | Wt 139.0 lb

## 2020-02-16 DIAGNOSIS — M999 Biomechanical lesion, unspecified: Secondary | ICD-10-CM | POA: Diagnosis not present

## 2020-02-16 DIAGNOSIS — M216X1 Other acquired deformities of right foot: Secondary | ICD-10-CM

## 2020-02-16 NOTE — Patient Instructions (Signed)
Good to see you Exercise 3 times a week Beach body on demand Pennsaid small amount over most painful spot See me when you need me

## 2020-02-16 NOTE — Assessment & Plan Note (Signed)
Possible transverse arch.  Patient has had doing very well with the manipulation.  Patient has had some mild tightness overall we will get patient fitted for custom orthotics again.  Mild pain with more than ankle sprain.  Follow-up again as needed

## 2020-02-16 NOTE — Progress Notes (Signed)
[name] [address] [place] [address] Phone: [phone] Subjective:   Holly Riddle, am serving as a scribe for Dr. Antoine Primas. This visit occurred during the SARS-CoV-2 public health emergency.  Safety protocols were in place, including screening questions prior to the visit, additional usage of staff PPE, and extensive cleaning of exam room while observing appropriate contact time as indicated for disinfecting solutions.   I'm seeing this patient by the request  of:  Holly Koyanagi, DO  CC: Foot pain follow-up  [id number]  [name] is a 44 y.o. female coming in with complaint of bilateral foot pain. Starting to have lateral ankle pain in right foot. Also pain in metatarsal heads. Runs 3 miles, 3-4 times a week. Continues strength and stretching.       Past Medical History:  Diagnosis Date  . Allergy   . Depression    Past Surgical History:  Procedure Laterality Date  . ABLATION  03/2019   Social History   Socioeconomic History  . Marital status: Married    Spouse name: Not on file  . Number of children: Not on file  . Years of education: Not on file  . Highest education level: Not on file  Occupational History  . Not on file  Tobacco Use  . Smoking status: Never Smoker  . Smokeless tobacco: Never Used  Substance and Sexual Activity  . Alcohol use: Yes    Alcohol/week: 1.0 standard drink    Types: 1 Glasses of wine per week  . Drug use: No  . Sexual activity: Not on file  Other Topics Concern  . Not on file  Social History Narrative   Work or School: homemaker      Home Situation: husband, 3 children      Spiritual Beliefs: Christian      Lifestyle: runs - 8 miles per week, diet is goood            International aid/development worker of Corporate investment banker Strain:   . Difficulty of Paying Living Expenses:   Food Insecurity:   . Worried About Programme researcher, broadcasting/film/video in the Last Year:   . Barista  in the Last Year:   Transportation Needs:   . Freight forwarder (Medical):   Marland Kitchen Lack of Transportation (Non-Medical):   Physical Activity:   . Days of Exercise per Week:   . Minutes of Exercise per Session:   Stress:   . Feeling of Stress :   Social Connections:   . Frequency of Communication with Friends and Family:   . Frequency of Social Gatherings with Friends and Family:   . Attends Religious Services:   . Active Member of Clubs or Organizations:   . Attends Banker Meetings:   Marland Kitchen Marital Status:    No Known Allergies Family History  Problem Relation Age of Onset  . Alcohol abuse Father   . Hyperlipidemia Father   . Heart disease Father 44  . Hypertension Father   . Cancer Maternal Grandmother 45       breast  . Cancer Maternal Grandfather        lung  . Diabetes Maternal Grandfather   . Arthritis Paternal Grandmother   . Diabetes Paternal Grandmother   . Mental illness Paternal Grandmother   . Healthy Brother       Current Outpatient Medications (Respiratory):  .  cetirizine (ZYRTEC) 10 MG tablet, Take 10 mg  by mouth daily. .  fluticasone (FLONASE) 50 MCG/ACT nasal spray, Place 2 sprays into both nostrils daily.    Current Outpatient Medications (Other):  .  sertraline (ZOLOFT) 50 MG tablet, Take 1 tablet (50 mg total) by mouth daily.   Reviewed prior external information including notes and imaging from  primary care provider As well as notes that were available from care everywhere and other healthcare systems.  Past medical history, social, surgical and family history all reviewed in electronic medical record.  No pertanent information unless stated regarding to the chief complaint.   Review of Systems:  No headache, visual changes, nausea, vomiting, diarrhea, constipation, dizziness, abdominal pain, skin rash, fevers, chills, night sweats, weight loss, swollen lymph nodes, body aches, joint swelling, chest pain, shortness of breath, mood  changes. POSITIVE muscle aches  Objective  Blood pressure 104/72, pulse (!) 52, height 5\' 7"  (1.702 m), weight 139 lb (63 kg), SpO2 96 %.   General: No apparent distress alert and oriented x3 mood and affect normal, dressed appropriately.  HEENT: Pupils equal, extraocular movements intact  Respiratory: Patient's speak in full sentences and does not appear short of breath  Cardiovascular: No lower extremity edema, non tender, no erythema  Neuro: Cranial nerves II through XII are intact, neurovascularly intact in all extremities with 2+ DTRs and 2+ pulses.  Gait normal with good balance and coordination.  MSK:  Non tender with full range of motion and good stability and symmetric strength and tone of shoulders, elbows, wrist, hip, knee and ankles bilaterally.  Foot exam still shows the breakdown of the transverse arch noted.  Patient does have splaying between the first and second toes.  Patient has very mild callus formation between the second and third toes on the plantar aspect.  Near full range of motion of the ankle with very minimal tenderness over the ATFL negative anterior drawer   Impression and Recommendations:     The above documentation has been reviewed and is accurate and complete Lyndal Pulley, DO       Note: This dictation was prepared with Dragon dictation along with smaller phrase technology. Any transcriptional errors that result from this process are unintentional.

## 2020-02-28 ENCOUNTER — Telehealth: Payer: Self-pay | Admitting: Family Medicine

## 2020-02-28 ENCOUNTER — Other Ambulatory Visit: Payer: Self-pay

## 2020-02-28 DIAGNOSIS — M25551 Pain in right hip: Secondary | ICD-10-CM

## 2020-02-28 DIAGNOSIS — M216X1 Other acquired deformities of right foot: Secondary | ICD-10-CM

## 2020-02-28 NOTE — Telephone Encounter (Signed)
Patient called stating that Dr Katrinka Blazing mentioned that someone from PT would contact her to schedule but she has not heard anything. I did not see an order in? Do you know if this has been sent?

## 2020-02-28 NOTE — Telephone Encounter (Signed)
Order in and patient notified via MyChart.

## 2020-02-29 ENCOUNTER — Other Ambulatory Visit: Payer: Self-pay

## 2020-02-29 DIAGNOSIS — M216X1 Other acquired deformities of right foot: Secondary | ICD-10-CM

## 2020-03-05 ENCOUNTER — Other Ambulatory Visit: Payer: Self-pay | Admitting: Family Medicine

## 2020-03-06 NOTE — Telephone Encounter (Signed)
She will be due for physical in July.

## 2020-03-07 NOTE — Telephone Encounter (Signed)
Spoke with the pt and scheduled an appt for 8/27 to arrive at 9:15am for CPE.

## 2020-03-08 ENCOUNTER — Other Ambulatory Visit: Payer: Self-pay

## 2020-03-08 ENCOUNTER — Ambulatory Visit (INDEPENDENT_AMBULATORY_CARE_PROVIDER_SITE_OTHER): Payer: Managed Care, Other (non HMO) | Admitting: Family Medicine

## 2020-03-08 ENCOUNTER — Encounter: Payer: Self-pay | Admitting: Family Medicine

## 2020-03-08 DIAGNOSIS — M216X1 Other acquired deformities of right foot: Secondary | ICD-10-CM

## 2020-03-08 NOTE — Assessment & Plan Note (Signed)
Previously did well with orthotics.  Has had some pain with the most recent orthotics being on 44 years old. -Counseled on home exercise therapy and supportive care. -Orthotics. -Could consider metatarsal pads.

## 2020-03-08 NOTE — Progress Notes (Signed)
Holly Riddle - 44 y.o. female MRN [id number]  Date of birth: 10-Dec-1975  SUBJECTIVE:  Including CC & ROS.  Chief Complaint  Patient presents with  . Foot Orthotics    Holly Riddle is a 44 y.o. female that is presenting with foot pain.  She has had orthotics made previously.  That seems to improve her symptoms.  She runs between 3 to 5 miles every other day or so.  Denies any injury or inciting event.  Pain is intermittent in nature.   Review of Systems See HPI   HISTORY: Past Medical, Surgical, Social, and Family History Reviewed & Updated per EMR.   Pertinent Historical Findings include:  Past Medical History:  Diagnosis Date  . Allergy   . Depression     Past Surgical History:  Procedure Laterality Date  . ABLATION  03/2019    Family History  Problem Relation Age of Onset  . Alcohol abuse Father   . Hyperlipidemia Father   . Heart disease Father 70  . Hypertension Father   . Cancer Maternal Grandmother 38       breast  . Cancer Maternal Grandfather        lung  . Diabetes Maternal Grandfather   . Arthritis Paternal Grandmother   . Diabetes Paternal Grandmother   . Mental illness Paternal Grandmother   . Healthy Brother     Social History   Socioeconomic History  . Marital status: Married    Spouse name: Not on file  . Number of children: Not on file  . Years of education: Not on file  . Highest education level: Not on file  Occupational History  . Not on file  Tobacco Use  . Smoking status: Never Smoker  . Smokeless tobacco: Never Used  Substance and Sexual Activity  . Alcohol use: Yes    Alcohol/week: 1.0 standard drink    Types: 1 Glasses of wine per week  . Drug use: No  . Sexual activity: Not on file  Other Topics Concern  . Not on file  Social History Narrative   Work or School: homemaker      Home Situation: husband, 3 children      Spiritual Beliefs: Christian      Lifestyle: runs - 8 miles per week, diet is goood             International aid/development worker of Corporate investment banker Strain:   . Difficulty of Paying Living Expenses:   Food Insecurity:   . Worried About Programme researcher, broadcasting/film/video in the Last Year:   . Barista in the Last Year:   Transportation Needs:   . Freight forwarder (Medical):   Marland Kitchen Lack of Transportation (Non-Medical):   Physical Activity:   . Days of Exercise per Week:   . Minutes of Exercise per Session:   Stress:   . Feeling of Stress :   Social Connections:   . Frequency of Communication with Friends and Family:   . Frequency of Social Gatherings with Friends and Family:   . Attends Religious Services:   . Active Member of Clubs or Organizations:   . Attends Banker Meetings:   Marland Kitchen Marital Status:   Intimate Partner Violence:   . Fear of Current or Ex-Partner:   . Emotionally Abused:   Marland Kitchen Physically Abused:   . Sexually Abused:      PHYSICAL EXAM:  VS: BP 106/70   Pulse 69   Ht 5\' 7"  (  1.702 m)   Wt 136 lb (61.7 kg)   BMI 21.30 kg/m  Physical Exam Gen: NAD, alert, cooperative with exam, well-appearing MSK:  Right and left foot: Some loss of the transverse arch. Well-maintained longitudinal arch. Neurovascular intact  Patient was fitted for a standard, cushioned, semi-rigid orthotic. The orthotic was heated and afterward the patient stood on the orthotic blank positioned on the orthotic stand. The patient was positioned in subtalar neutral position and 10 degrees of ankle dorsiflexion in a weight bearing stance. After completion of molding, a stable base was applied to the orthotic blank. The blank was ground to a stable position for weight bearing. Size: 48M Pairs: 2 Base: Blue EVA Additional Posting and Padding: None The patient ambulated these, and they were very comfortable.    ASSESSMENT & PLAN:   Loss of transverse plantar arch of right foot Previously did well with orthotics.  Has had some pain with the most recent orthotics being on 44  years old. -Counseled on home exercise therapy and supportive care. -Orthotics. -Could consider metatarsal pads.

## 2020-05-04 ENCOUNTER — Encounter: Payer: Self-pay | Admitting: Family Medicine

## 2020-05-04 ENCOUNTER — Other Ambulatory Visit: Payer: Self-pay

## 2020-05-04 ENCOUNTER — Ambulatory Visit (INDEPENDENT_AMBULATORY_CARE_PROVIDER_SITE_OTHER): Payer: Managed Care, Other (non HMO) | Admitting: Family Medicine

## 2020-05-04 VITALS — BP 102/70 | HR 71 | Temp 98.1°F | Ht 67.25 in | Wt 140.5 lb

## 2020-05-04 DIAGNOSIS — F331 Major depressive disorder, recurrent, moderate: Secondary | ICD-10-CM | POA: Diagnosis not present

## 2020-05-04 DIAGNOSIS — E781 Pure hyperglyceridemia: Secondary | ICD-10-CM

## 2020-05-04 DIAGNOSIS — Z0000 Encounter for general adult medical examination without abnormal findings: Secondary | ICD-10-CM

## 2020-05-04 DIAGNOSIS — F419 Anxiety disorder, unspecified: Secondary | ICD-10-CM

## 2020-05-04 DIAGNOSIS — R7309 Other abnormal glucose: Secondary | ICD-10-CM | POA: Diagnosis not present

## 2020-05-04 MED ORDER — SERTRALINE HCL 50 MG PO TABS: 75.0000 mg | ORAL_TABLET | Freq: Every day | ORAL | 1 refills | Status: DC

## 2020-05-04 NOTE — Progress Notes (Signed)
Holly Riddle DOB: Jan 10, 1976 Encounter date: 05/04/2020  This is a 44 y.o. female who presents for complete physical   History of present illness/Additional concerns: Last visit with me was July/2020 for establish care.  Anxiety/Depression: zoloft - doing ok for mood overall. Struggles with stress. Doing more  Meditation/yoga. Kids back in school. Feels like she could be doing better with mood overall.  Allergies: flonase, zyrtec. Doing well with this.   Has done well since ablation. Dr. Renaldo Fiddler is gynecologist. Following regularly.   Had dermatologist but hasn't been in a few years. No current skin concerns, but not super observant.    Past Medical History:  Diagnosis Date  . Allergy   . Depression    Past Surgical History:  Procedure Laterality Date  . ABLATION  03/2019   No Known Allergies Current Meds  Medication Sig  . cetirizine (ZYRTEC) 10 MG tablet Take 10 mg by mouth daily.  . fluticasone (FLONASE) 50 MCG/ACT nasal spray Place 2 sprays into both nostrils daily.  . [DISCONTINUED] sertraline (ZOLOFT) 50 MG tablet TAKE ONE TABLET BY MOUTH DAILY   Social History   Tobacco Use  . Smoking status: Never Smoker  . Smokeless tobacco: Never Used  Substance Use Topics  . Alcohol use: Yes    Alcohol/week: 1.0 standard drink    Types: 1 Glasses of wine per week   Family History  Problem Relation Age of Onset  . Alcohol abuse Father   . Hyperlipidemia Father   . Heart disease Father 73  . Hypertension Father   . Cancer Maternal Grandmother 80       breast  . Cancer Maternal Grandfather        lung  . Diabetes Maternal Grandfather   . Arthritis Paternal Grandmother   . Diabetes Paternal Grandmother   . Mental illness Paternal Grandmother   . Healthy Brother      Review of Systems  Constitutional: Negative for activity change, appetite change, chills, fatigue, fever and unexpected weight change.  HENT: Negative for congestion, ear pain, hearing loss, sinus  pressure, sinus pain, sore throat and trouble swallowing.   Eyes: Negative for pain and visual disturbance.  Respiratory: Negative for cough, chest tightness, shortness of breath and wheezing.   Cardiovascular: Negative for chest pain, palpitations and leg swelling.  Gastrointestinal: Negative for abdominal pain, blood in stool, constipation, diarrhea, nausea and vomiting.  Genitourinary: Negative for difficulty urinating and menstrual problem.  Musculoskeletal: Negative for arthralgias and back pain.  Skin: Negative for rash.  Neurological: Negative for dizziness, weakness, numbness and headaches.  Hematological: Negative for adenopathy. Does not bruise/bleed easily.  Psychiatric/Behavioral: Negative for sleep disturbance and suicidal ideas. The patient is nervous/anxious.     CBC:  Lab Results  Component Value Date   WBC 5.7 05/04/2020   HGB 15.4 05/04/2020   HCT 46.0 (H) 05/04/2020   MCH 30.4 05/04/2020   MCHC 33.5 05/04/2020   RDW 12.3 05/04/2020   PLT 286 05/04/2020   MPV 10.0 05/04/2020   CMP: Lab Results  Component Value Date   NA 139 05/04/2020   K 4.4 05/04/2020   CL 102 05/04/2020   CO2 28 05/04/2020   GLUCOSE 83 05/04/2020   BUN 16 05/04/2020   CREATININE 0.95 05/04/2020   CALCIUM 9.7 05/04/2020   PROT 6.9 05/04/2020   BILITOT 0.4 05/04/2020   ALT 11 05/04/2020   AST 15 05/04/2020   LIPID: Lab Results  Component Value Date   CHOL 189 05/04/2020  TRIG 104 05/04/2020   HDL 62 05/04/2020   LDLCALC 107 (H) 05/04/2020    Objective:  BP 102/70 (BP Location: Left Arm, Patient Position: Sitting, Cuff Size: Normal)   Pulse 71   Temp 98.1 F (36.7 C) (Oral)   Ht 5' 7.25" (1.708 m)   Wt 140 lb 8 oz (63.7 kg)   SpO2 98%   BMI 21.84 kg/m   Weight: 140 lb 8 oz (63.7 kg)   BP Readings from Last 3 Encounters:  05/04/20 102/70  03/08/20 106/70  02/16/20 104/72   Wt Readings from Last 3 Encounters:  05/04/20 140 lb 8 oz (63.7 kg)  03/08/20 136 lb (61.7  kg)  02/16/20 139 lb (63 kg)    Physical Exam Constitutional:      General: She is not in acute distress.    Appearance: She is well-developed.  HENT:     Head: Normocephalic and atraumatic.     Right Ear: External ear normal.     Left Ear: External ear normal.     Mouth/Throat:     Pharynx: No oropharyngeal exudate.  Eyes:     Conjunctiva/sclera: Conjunctivae normal.     Pupils: Pupils are equal, round, and reactive to light.  Neck:     Thyroid: No thyromegaly.  Cardiovascular:     Rate and Rhythm: Normal rate and regular rhythm.     Heart sounds: Normal heart sounds. No murmur heard.  No friction rub. No gallop.   Pulmonary:     Effort: Pulmonary effort is normal.     Breath sounds: Normal breath sounds.  Abdominal:     General: Bowel sounds are normal. There is no distension.     Palpations: Abdomen is soft. There is no mass.     Tenderness: There is no abdominal tenderness. There is no guarding.     Hernia: No hernia is present.  Musculoskeletal:        General: No tenderness or deformity. Normal range of motion.     Cervical back: Normal range of motion and neck supple.  Lymphadenopathy:     Cervical: No cervical adenopathy.  Skin:    General: Skin is warm and dry.     Findings: No rash.  Neurological:     Mental Status: She is alert and oriented to person, place, and time.     Deep Tendon Reflexes: Reflexes normal.     Reflex Scores:      Tricep reflexes are 2+ on the right side and 2+ on the left side.      Bicep reflexes are 2+ on the right side and 2+ on the left side.      Brachioradialis reflexes are 2+ on the right side and 2+ on the left side.      Patellar reflexes are 2+ on the right side and 2+ on the left side. Psychiatric:        Speech: Speech normal.        Behavior: Behavior normal.        Thought Content: Thought content normal.      Depression screen [facility] 2/9 05/04/2020 04/01/2019 12/31/2018 12/30/2018  Decreased Interest 0 1 0 0  Down,  Depressed, Hopeless 1 1 0 0  PHQ - 2 Score 1 2 0 0  Altered sleeping 0 0 - -  Tired, decreased energy 0 0 - -  Change in appetite 0 1 - -  Feeling bad or failure about yourself  0 0 - -  Trouble concentrating  0 3 - -  Moving slowly or fidgety/restless 0 0 - -  Suicidal thoughts 0 0 - -  PHQ-9 Score 1 6 - -  Difficult doing work/chores - Not difficult at all - -    Assessment/Plan: Health Maintenance Due  Topic Date Due  . INFLUENZA VACCINE  04/08/2020   Health Maintenance reviewed.  1. Preventative health care Keep up with healthy eating, regular  Exercise. - CBC with Differential/Platelet; Future - Hepatitis C antibody; Future - Hepatitis C antibody - CBC with Differential/Platelet  2. Moderate episode of recurrent major depressive disorder (HCC) We are going to increase zoloft to 75mg  daily. She will update me on how this works for her in 2-4 weeks. Follow up sooner pending this response.   3. High triglycerides - Lipid panel; Future - TSH; Future - TSH - Lipid panel  4. Elevated glucose - Comprehensive metabolic panel; Future - Hemoglobin A1c; Future - Hemoglobin A1c - Comprehensive metabolic panel  5. Anxiety - see above. Increasing zoloft to help with anxiety overall. She will continue to work with her yoga, exercise, meditation.    Return in about 6 months (around 11/04/2020) for Chronic condition visit.  11/06/2020, MD

## 2020-05-04 NOTE — Patient Instructions (Signed)
Send me a mychart update in 2-4 weeks regarding anxiety.

## 2020-05-07 LAB — COMPREHENSIVE METABOLIC PANEL
AG Ratio: 2.3 (calc) (ref 1.0–2.5)
ALT: 11 U/L (ref 6–29)
AST: 15 U/L (ref 10–30)
Albumin: 4.8 g/dL (ref 3.6–5.1)
Alkaline phosphatase (APISO): 71 U/L (ref 31–125)
BUN: 16 mg/dL (ref 7–25)
CO2: 28 mmol/L (ref 20–32)
Calcium: 9.7 mg/dL (ref 8.6–10.2)
Chloride: 102 mmol/L (ref 98–110)
Creat: 0.95 mg/dL (ref 0.50–1.10)
Globulin: 2.1 g/dL (calc) (ref 1.9–3.7)
Glucose, Bld: 83 mg/dL (ref 65–99)
Potassium: 4.4 mmol/L (ref 3.5–5.3)
Sodium: 139 mmol/L (ref 135–146)
Total Bilirubin: 0.4 mg/dL (ref 0.2–1.2)
Total Protein: 6.9 g/dL (ref 6.1–8.1)

## 2020-05-07 LAB — CBC WITH DIFFERENTIAL/PLATELET
Absolute Monocytes: 616 cells/uL (ref 200–950)
Basophils Absolute: 29 cells/uL (ref 0–200)
Basophils Relative: 0.5 %
Eosinophils Absolute: 160 cells/uL (ref 15–500)
Eosinophils Relative: 2.8 %
HCT: 46 % — ABNORMAL HIGH (ref 35.0–45.0)
Hemoglobin: 15.4 g/dL (ref 11.7–15.5)
Lymphs Abs: 2143 cells/uL (ref 850–3900)
MCH: 30.4 pg (ref 27.0–33.0)
MCHC: 33.5 g/dL (ref 32.0–36.0)
MCV: 90.9 fL (ref 80.0–100.0)
MPV: 10 fL (ref 7.5–12.5)
Monocytes Relative: 10.8 %
Neutro Abs: 2753 cells/uL (ref 1500–7800)
Neutrophils Relative %: 48.3 %
Platelets: 286 10*3/uL (ref 140–400)
RBC: 5.06 10*6/uL (ref 3.80–5.10)
RDW: 12.3 % (ref 11.0–15.0)
Total Lymphocyte: 37.6 %
WBC: 5.7 10*3/uL (ref 3.8–10.8)

## 2020-05-07 LAB — HEPATITIS C ANTIBODY
Hepatitis C Ab: NONREACTIVE
SIGNAL TO CUT-OFF: 0.01 (ref <1.00)

## 2020-05-07 LAB — HEMOGLOBIN A1C
Hgb A1c MFr Bld: 5.2 % of total Hgb (ref <5.7)
Mean Plasma Glucose: 103 (calc)
eAG (mmol/L): 5.7 (calc)

## 2020-05-07 LAB — LIPID PANEL
Cholesterol: 189 mg/dL (ref <200)
HDL: 62 mg/dL (ref >=50)
LDL Cholesterol (Calc): 107 mg/dL (calc) — ABNORMAL HIGH
Non-HDL Cholesterol (Calc): 127 mg/dL (calc) (ref <130)
Total CHOL/HDL Ratio: 3 (calc) (ref <5.0)
Triglycerides: 104 mg/dL (ref <150)

## 2020-05-07 LAB — TSH: TSH: 2.37 mIU/L

## 2020-06-01 ENCOUNTER — Other Ambulatory Visit: Payer: Self-pay

## 2020-06-01 ENCOUNTER — Ambulatory Visit: Payer: Managed Care, Other (non HMO) | Admitting: Family Medicine

## 2020-06-01 VITALS — BP 120/80 | HR 63 | Temp 97.9°F | Wt 139.7 lb

## 2020-06-01 DIAGNOSIS — Z23 Encounter for immunization: Secondary | ICD-10-CM

## 2020-06-01 DIAGNOSIS — H6121 Impacted cerumen, right ear: Secondary | ICD-10-CM

## 2020-06-01 NOTE — Patient Instructions (Signed)
Earwax Buildup, Adult The ears produce a substance called earwax that helps keep bacteria out of the ear and protects the skin in the ear canal. Occasionally, earwax can build up in the ear and cause discomfort or hearing loss. What increases the risk? This condition is more likely to develop in people who:  Are female.  Are elderly.  Naturally produce more earwax.  Clean their ears often with cotton swabs.  Use earplugs often.  Use in-ear headphones often.  Wear hearing aids.  Have narrow ear canals.  Have earwax that is overly thick or sticky.  Have eczema.  Are dehydrated.  Have excess hair in the ear canal. What are the signs or symptoms? Symptoms of this condition include:  Reduced or muffled hearing.  A feeling of fullness in the ear or feeling that the ear is plugged.  Fluid coming from the ear.  Ear pain.  Ear itch.  Ringing in the ear.  Coughing.  An obvious piece of earwax that can be seen inside the ear canal. How is this diagnosed? This condition may be diagnosed based on:  Your symptoms.  Your medical history.  An ear exam. During the exam, your health care provider will look into your ear with an instrument called an otoscope. You may have tests, including a hearing test. How is this treated? This condition may be treated by:  Using ear drops to soften the earwax.  Having the earwax removed by a health care provider. The health care provider may: ? Flush the ear with water. ? Use an instrument that has a loop on the end (curette). ? Use a suction device.  Surgery to remove the wax buildup. This may be done in severe cases. Follow these instructions at home:   Take over-the-counter and prescription medicines only as told by your health care provider.  Do not put any objects, including cotton swabs, into your ear. You can clean the opening of your ear canal with a washcloth or facial tissue.  Follow instructions from your health care  provider about cleaning your ears. Do not over-clean your ears.  Drink enough fluid to keep your urine clear or pale yellow. This will help to thin the earwax.  Keep all follow-up visits as told by your health care provider. If earwax builds up in your ears often or if you use hearing aids, consider seeing your health care provider for routine, preventive ear cleanings. Ask your health care provider how often you should schedule your cleanings.  If you have hearing aids, clean them according to instructions from the manufacturer and your health care provider. Contact a health care provider if:  You have ear pain.  You develop a fever.  You have blood, pus, or other fluid coming from your ear.  You have hearing loss.  You have ringing in your ears that does not go away.  Your symptoms do not improve with treatment.  You feel like the room is spinning (vertigo). Summary  Earwax can build up in the ear and cause discomfort or hearing loss.  The most common symptoms of this condition include reduced or muffled hearing and a feeling of fullness in the ear or feeling that the ear is plugged.  This condition may be diagnosed based on your symptoms, your medical history, and an ear exam.  This condition may be treated by using ear drops to soften the earwax or by having the earwax removed by a health care provider.  Do not put any   objects, including cotton swabs, into your ear. You can clean the opening of your ear canal with a washcloth or facial tissue. This information is not intended to replace advice given to you by your health care provider. Make sure you discuss any questions you have with your health care provider. Document Revised: 08/07/2017 Document Reviewed: 11/05/2016 Elsevier Patient Education  2020 Elsevier Inc.  

## 2020-06-01 NOTE — Progress Notes (Signed)
Established Patient Office Visit  Subjective:  Patient ID: Holly Riddle, female    DOB: 01-09-1976  Age: 44 y.o. MRN: [id number]  CC:  Chief Complaint  Patient presents with  . Ear Fullness    Right ear    HPI Holly Riddle presents for right ear fullness.  She was actually seen here a month ago and had attempted irrigation of ear canal for cerumen.  They were unable to fully remove cerumen.  She has been using Debrox since then to soften the wax.  She states she has lost almost total hearing in the right ear.  Left is unimpeded.  No drainage.  No ear pain.  No dizziness.  Past Medical History:  Diagnosis Date  . Allergy   . Depression     Past Surgical History:  Procedure Laterality Date  . ABLATION  03/2019    Family History  Problem Relation Age of Onset  . Alcohol abuse Father   . Hyperlipidemia Father   . Heart disease Father 42  . Hypertension Father   . Cancer Maternal Grandmother 53       breast  . Cancer Maternal Grandfather        lung  . Diabetes Maternal Grandfather   . Arthritis Paternal Grandmother   . Diabetes Paternal Grandmother   . Mental illness Paternal Grandmother   . Healthy Brother     Social History   Socioeconomic History  . Marital status: Married    Spouse name: Not on file  . Number of children: Not on file  . Years of education: Not on file  . Highest education level: Not on file  Occupational History  . Not on file  Tobacco Use  . Smoking status: Never Smoker  . Smokeless tobacco: Never Used  Substance and Sexual Activity  . Alcohol use: Yes    Alcohol/week: 1.0 standard drink    Types: 1 Glasses of wine per week  . Drug use: No  . Sexual activity: Not on file  Other Topics Concern  . Not on file  Social History Narrative   Work or School: homemaker      Home Situation: husband, 3 children      Spiritual Beliefs: Christian      Lifestyle: runs - 8 miles per week, diet is goood            Electrical engineer of Corporate investment banker Strain:   . Difficulty of Paying Living Expenses: Not on file  Food Insecurity:   . Worried About Programme researcher, broadcasting/film/video in the Last Year: Not on file  . Ran Out of Food in the Last Year: Not on file  Transportation Needs:   . Lack of Transportation (Medical): Not on file  . Lack of Transportation (Non-Medical): Not on file  Physical Activity:   . Days of Exercise per Week: Not on file  . Minutes of Exercise per Session: Not on file  Stress:   . Feeling of Stress : Not on file  Social Connections:   . Frequency of Communication with Friends and Family: Not on file  . Frequency of Social Gatherings with Friends and Family: Not on file  . Attends Religious Services: Not on file  . Active Member of Clubs or Organizations: Not on file  . Attends Banker Meetings: Not on file  . Marital Status: Not on file  Intimate Partner Violence:   . Fear of Current or Ex-Partner: Not on file  .  Emotionally Abused: Not on file  . Physically Abused: Not on file  . Sexually Abused: Not on file    Outpatient Medications Prior to Visit  Medication Sig Dispense Refill  . cetirizine (ZYRTEC) 10 MG tablet Take 10 mg by mouth daily.    . fluticasone (FLONASE) 50 MCG/ACT nasal spray Place 2 sprays into both nostrils daily. 16 g 3  . sertraline (ZOLOFT) 50 MG tablet Take 1.5 tablets (75 mg total) by mouth daily. 135 tablet 1   No facility-administered medications prior to visit.    No Known Allergies  ROS Review of Systems  Constitutional: Negative for chills and fever.  HENT: Positive for hearing loss.        Hearing loss right side only  Neurological: Negative for dizziness.      Objective:    Physical Exam Vitals reviewed.  Constitutional:      Appearance: Normal appearance.  HENT:     Ears:     Comments: Left ear canal and eardrum are normal.  She has impacted cerumen right canal Neurological:     Mental Status: She is alert.      BP 120/80 (BP Location: Right Arm, Patient Position: Sitting, Cuff Size: Normal)   Pulse 63   Temp 97.9 F (36.6 C)   Wt 139 lb 11.2 oz (63.4 kg)   SpO2 98%   BMI 21.72 kg/m  Wt Readings from Last 3 Encounters:  06/01/20 139 lb 11.2 oz (63.4 kg)  05/04/20 140 lb 8 oz (63.7 kg)  03/08/20 136 lb (61.7 kg)     Health Maintenance Due  Topic Date Due  . INFLUENZA VACCINE  Never done    There are no preventive care reminders to display for this patient.  Lab Results  Component Value Date   TSH 2.37 05/04/2020   Lab Results  Component Value Date   WBC 5.7 05/04/2020   HGB 15.4 05/04/2020   HCT 46.0 (H) 05/04/2020   MCV 90.9 05/04/2020   PLT 286 05/04/2020   Lab Results  Component Value Date   NA 139 05/04/2020   K 4.4 05/04/2020   CO2 28 05/04/2020   GLUCOSE 83 05/04/2020   BUN 16 05/04/2020   CREATININE 0.95 05/04/2020   BILITOT 0.4 05/04/2020   AST 15 05/04/2020   ALT 11 05/04/2020   PROT 6.9 05/04/2020   CALCIUM 9.7 05/04/2020   Lab Results  Component Value Date   CHOL 189 05/04/2020   Lab Results  Component Value Date   HDL 62 05/04/2020   Lab Results  Component Value Date   LDLCALC 107 (H) 05/04/2020   Lab Results  Component Value Date   TRIG 104 05/04/2020   Lab Results  Component Value Date   CHOLHDL 3.0 05/04/2020   Lab Results  Component Value Date   HGBA1C 5.2 05/04/2020      Assessment & Plan:   Impacted cerumen right canal.  -Discussed risk of removal with curette and/or irrigation including pain, bleeding, low risk of perforation.  Patient consented.  Using a curette we were able to move the majority of the wax out of the right canal.  She had little bit of residual wax up against the eardrum which requires irrigation.  With irrigation remainder was removed.  Pt tolerated well .  TM appears normal.   No orders of the defined types were placed in this encounter.   Follow-up: No follow-ups on file.    Holly Peat,  MD

## 2020-06-09 ENCOUNTER — Other Ambulatory Visit: Payer: Self-pay | Admitting: Family Medicine

## 2020-06-18 ENCOUNTER — Encounter: Payer: Self-pay | Admitting: Family Medicine

## 2020-07-06 ENCOUNTER — Encounter: Payer: Managed Care, Other (non HMO) | Admitting: Family Medicine

## 2020-07-06 NOTE — Progress Notes (Deleted)
Holly Riddle - 44 y.o. female MRN [id number]  Date of birth: [birth year]  SUBJECTIVE:  Including CC & ROS.  No chief complaint on file.   Holly Riddle is a 44 y.o. female that is  ***.  ***   Review of Systems See HPI   HISTORY: Past Medical, Surgical, Social, and Family History Reviewed & Updated per EMR.   Pertinent Historical Findings include:  Past Medical History:  Diagnosis Date  . Allergy   . Depression     Past Surgical History:  Procedure Laterality Date  . ABLATION  03/2019    Family History  Problem Relation Age of Onset  . Alcohol abuse Father   . Hyperlipidemia Father   . Heart disease Father 74  . Hypertension Father   . Cancer Maternal Grandmother 11       breast  . Cancer Maternal Grandfather        lung  . Diabetes Maternal Grandfather   . Arthritis Paternal Grandmother   . Diabetes Paternal Grandmother   . Mental illness Paternal Grandmother   . Healthy Brother     Social History   Socioeconomic History  . Marital status: Married    Spouse name: Not on file  . Number of children: Not on file  . Years of education: Not on file  . Highest education level: Not on file  Occupational History  . Not on file  Tobacco Use  . Smoking status: Never Smoker  . Smokeless tobacco: Never Used  Substance and Sexual Activity  . Alcohol use: Yes    Alcohol/week: 1.0 standard drink    Types: 1 Glasses of wine per week  . Drug use: No  . Sexual activity: Not on file  Other Topics Concern  . Not on file  Social History Narrative   Work or School: homemaker      Home Situation: husband, 3 children      Spiritual Beliefs: Christian      Lifestyle: runs - 8 miles per week, diet is goood            International aid/development worker of Corporate investment banker Strain:   . Difficulty of Paying Living Expenses: Not on file  Food Insecurity:   . Worried About Programme researcher, broadcasting/film/video in the Last Year: Not on file  . Ran Out of Food in the Last Year: Not  on file  Transportation Needs:   . Lack of Transportation (Medical): Not on file  . Lack of Transportation (Non-Medical): Not on file  Physical Activity:   . Days of Exercise per Week: Not on file  . Minutes of Exercise per Session: Not on file  Stress:   . Feeling of Stress : Not on file  Social Connections:   . Frequency of Communication with Friends and Family: Not on file  . Frequency of Social Gatherings with Friends and Family: Not on file  . Attends Religious Services: Not on file  . Active Member of Clubs or Organizations: Not on file  . Attends Banker Meetings: Not on file  . Marital Status: Not on file  Intimate Partner Violence:   . Fear of Current or Ex-Partner: Not on file  . Emotionally Abused: Not on file  . Physically Abused: Not on file  . Sexually Abused: Not on file     PHYSICAL EXAM:  VS: There were no vitals taken for this visit. Physical Exam Gen: NAD, alert, cooperative with exam, well-appearing MSK:  ***  ASSESSMENT & PLAN:   No problem-specific Assessment & Plan notes found for this encounter.

## 2020-12-14 ENCOUNTER — Other Ambulatory Visit: Payer: Self-pay | Admitting: Family Medicine

## 2021-02-08 ENCOUNTER — Telehealth: Payer: Managed Care, Other (non HMO) | Admitting: Emergency Medicine

## 2021-02-08 DIAGNOSIS — R399 Unspecified symptoms and signs involving the genitourinary system: Secondary | ICD-10-CM

## 2021-02-08 NOTE — Progress Notes (Signed)
Holly Riddle,  Based on what you shared with me,  it has been so long since your last UTI and you are not experiencing burning sensation yet, it is best to be seen in person for a test to make sure that is what is causing your symptoms and if positive, a culture can be sent to ensure you are on the most appropriate antibiotic.  I feel your condition warrants further evaluation and I recommend that you be seen in a face to face office visit.   NOTE: If you entered your credit card information for this eVisit, you will not be charged. You may see a "hold" on your card for the $35 but that hold will drop off and you will not have a charge processed.   If you are having a true medical emergency please call 911.      For an urgent face to face visit, [place] has six urgent care centers for your convenience:     [facility] Health Urgent [facility] Center at [facility] Directions [phone] [address] [place], [place] [address] . 8 am - 4 pm Monday - Friday    [facility] Urgent [facility] Center [facility]) Get Driving Directions [phone] [address] [place], [place] [address] . 8 am to 8 pm Monday-Friday . 10 am to 6 pm [facility] Urgent [facility]) Get Driving Directions [phone]  [address] [place],  [place]  [address] . 8 am to 8 pm Monday-Friday . 8 am to 4 pm [facility] Urgent Care at [facility] Get Driving Directions [phone] [address] [place], [place] [address] . 8 am to 8 pm Monday-Friday . 8 am to 4 pm [facility] Urgent Care at [facility] Get Driving Directions  [phone] [address] [place], [place] [address] . 8 am to 8 pm Monday-Friday . 8 am to 4 pm [facility] Urgent Care at [facility] Directions [phone] [address], [address] [place], [place]  [address] . 8 am to 8 pm Monday-Friday . 8 am to 4 pm Saturday-Sunday     Your MyChart E-visit questionnaire answers were reviewed by a board certified advanced clinical practitioner to complete your personal care plan based on your specific symptoms.  Thank you for using e-Visits.    Approximately 5 minutes was spent documenting and reviewing patient's chart.

## 2021-03-06 ENCOUNTER — Other Ambulatory Visit: Payer: Self-pay | Admitting: Family Medicine

## 2021-06-07 ENCOUNTER — Encounter: Payer: Managed Care, Other (non HMO) | Admitting: Family Medicine

## 2021-06-10 ENCOUNTER — Other Ambulatory Visit: Payer: Self-pay

## 2021-06-10 ENCOUNTER — Ambulatory Visit (INDEPENDENT_AMBULATORY_CARE_PROVIDER_SITE_OTHER): Payer: Managed Care, Other (non HMO) | Admitting: Family Medicine

## 2021-06-10 ENCOUNTER — Encounter: Payer: Self-pay | Admitting: Family Medicine

## 2021-06-10 VITALS — BP 82/62 | HR 51 | Temp 97.8°F | Ht 67.75 in | Wt 144.1 lb

## 2021-06-10 DIAGNOSIS — Z23 Encounter for immunization: Secondary | ICD-10-CM | POA: Diagnosis not present

## 2021-06-10 DIAGNOSIS — Z131 Encounter for screening for diabetes mellitus: Secondary | ICD-10-CM | POA: Diagnosis not present

## 2021-06-10 DIAGNOSIS — Z862 Personal history of diseases of the blood and blood-forming organs and certain disorders involving the immune mechanism: Secondary | ICD-10-CM

## 2021-06-10 DIAGNOSIS — Z0000 Encounter for general adult medical examination without abnormal findings: Secondary | ICD-10-CM

## 2021-06-10 DIAGNOSIS — F411 Generalized anxiety disorder: Secondary | ICD-10-CM | POA: Diagnosis not present

## 2021-06-10 DIAGNOSIS — Z13220 Encounter for screening for lipoid disorders: Secondary | ICD-10-CM | POA: Diagnosis not present

## 2021-06-10 DIAGNOSIS — J302 Other seasonal allergic rhinitis: Secondary | ICD-10-CM

## 2021-06-10 DIAGNOSIS — F331 Major depressive disorder, recurrent, moderate: Secondary | ICD-10-CM | POA: Diagnosis not present

## 2021-06-10 LAB — COMPREHENSIVE METABOLIC PANEL
ALT: 12 U/L (ref 0–35)
AST: 19 U/L (ref 0–37)
Albumin: 4.7 g/dL (ref 3.5–5.2)
Alkaline Phosphatase: 67 U/L (ref 39–117)
BUN: 13 mg/dL (ref 6–23)
CO2: 29 mEq/L (ref 19–32)
Calcium: 9.7 mg/dL (ref 8.4–10.5)
Chloride: 101 mEq/L (ref 96–112)
Creatinine, Ser: 0.89 mg/dL (ref 0.40–1.20)
GFR: 79.53 mL/min (ref >60.00)
Glucose, Bld: 87 mg/dL (ref 70–99)
Potassium: 3.8 mEq/L (ref 3.5–5.1)
Sodium: 138 mEq/L (ref 135–145)
Total Bilirubin: 0.5 mg/dL (ref 0.2–1.2)
Total Protein: 7.5 g/dL (ref 6.0–8.3)

## 2021-06-10 LAB — LIPID PANEL
Cholesterol: 168 mg/dL (ref 0–200)
HDL: 55.3 mg/dL (ref >39.00)
LDL Cholesterol: 89 mg/dL (ref 0–99)
NonHDL: 113.06
Total CHOL/HDL Ratio: 3
Triglycerides: 119 mg/dL (ref 0.0–149.0)
VLDL: 23.8 mg/dL (ref 0.0–40.0)

## 2021-06-10 LAB — CBC WITH DIFFERENTIAL/PLATELET
Basophils Absolute: 0 10*3/uL (ref 0.0–0.1)
Basophils Relative: 0.3 % (ref 0.0–3.0)
Eosinophils Absolute: 0.1 10*3/uL (ref 0.0–0.7)
Eosinophils Relative: 2.4 % (ref 0.0–5.0)
HCT: 44.6 % (ref 36.0–46.0)
Hemoglobin: 14.9 g/dL (ref 12.0–15.0)
Lymphocytes Relative: 35.8 % (ref 12.0–46.0)
Lymphs Abs: 2 10*3/uL (ref 0.7–4.0)
MCHC: 33.5 g/dL (ref 30.0–36.0)
MCV: 91.4 fl (ref 78.0–100.0)
Monocytes Absolute: 0.5 10*3/uL (ref 0.1–1.0)
Monocytes Relative: 9.2 % (ref 3.0–12.0)
Neutro Abs: 3 10*3/uL (ref 1.4–7.7)
Neutrophils Relative %: 52.3 % (ref 43.0–77.0)
Platelets: 274 10*3/uL (ref 150.0–400.0)
RBC: 4.88 Mil/uL (ref 3.87–5.11)
RDW: 12.2 % (ref 11.5–15.5)
WBC: 5.7 10*3/uL (ref 4.0–10.5)

## 2021-06-10 MED ORDER — SERTRALINE HCL 50 MG PO TABS
50.0000 mg | ORAL_TABLET | Freq: Every day | ORAL | 0 refills | Status: DC
Start: 2021-06-10 — End: 2021-06-27

## 2021-06-10 NOTE — Progress Notes (Signed)
Holly Riddle DOB: 04/26/1976 Encounter date: 06/10/2021  This is a 45 y.o. female who presents for complete physical   History of present illness/Additional concerns: Last visit with me was 1 year ago.  She was feeling pretty well lately.   Anxiety/depression: Zoloft 75mg  has been doing well.   Allergies: Flonase, Zyrtec. Just stays on medication all year. Saw allergy last about 15 years ago. If she takes medication year round she is fine.   Follows with gynecology (Dr. )   Past Medical History:  Diagnosis Date   Allergy    Depression    Past Surgical History:  Procedure Laterality Date   ABLATION  03/2019   No Known Allergies Current Meds  Medication Sig   cetirizine (ZYRTEC) 10 MG tablet Take 10 mg by mouth daily.   fluticasone (FLONASE) 50 MCG/ACT nasal spray Place 2 sprays into both nostrils daily.   [DISCONTINUED] sertraline (ZOLOFT) 50 MG tablet TAKE 1 AND 1/2 TABLET BY MOUTH DAILY   Social History   Tobacco Use   Smoking status: Never   Smokeless tobacco: Never  Substance Use Topics   Alcohol use: Yes    Alcohol/week: 1.0 standard drink    Types: 1 Glasses of wine per week   Family History  Problem Relation Age of Onset   Alcohol abuse Father    Hyperlipidemia Father    Heart disease Father 42   Hypertension Father    Cancer Maternal Grandmother 66       breast   Cancer Maternal Grandfather        lung   Diabetes Maternal Grandfather    Arthritis Paternal Grandmother    Diabetes Paternal Grandmother    Mental illness Paternal Grandmother    Healthy Brother      Review of Systems  Constitutional:  Negative for activity change, appetite change, chills, fatigue, fever and unexpected weight change.  HENT:  Negative for congestion, ear pain, hearing loss, sinus pressure, sinus pain, sore throat and trouble swallowing.   Eyes:  Negative for pain and visual disturbance.  Respiratory:  Negative for cough, chest tightness, shortness of breath and  wheezing.   Cardiovascular:  Negative for chest pain, palpitations and leg swelling.  Gastrointestinal:  Negative for abdominal pain, blood in stool, constipation, diarrhea, nausea and vomiting.  Genitourinary:  Negative for difficulty urinating and menstrual problem.  Musculoskeletal:  Negative for arthralgias and back pain.  Skin:  Negative for rash.  Neurological:  Negative for dizziness, weakness, numbness and headaches.  Hematological:  Negative for adenopathy. Does not bruise/bleed easily.  Psychiatric/Behavioral:  Negative for sleep disturbance and suicidal ideas. The patient is not nervous/anxious.    CBC:  Lab Results  Component Value Date   WBC 5.7 05/04/2020   HGB 15.4 05/04/2020   HCT 46.0 (H) 05/04/2020   MCH 30.4 05/04/2020   MCHC 33.5 05/04/2020   RDW 12.3 05/04/2020   PLT 286 05/04/2020   MPV 10.0 05/04/2020   CMP: Lab Results  Component Value Date   NA 139 05/04/2020   K 4.4 05/04/2020   CL 102 05/04/2020   CO2 28 05/04/2020   GLUCOSE 83 05/04/2020   BUN 16 05/04/2020   CREATININE 0.95 05/04/2020   CALCIUM 9.7 05/04/2020   PROT 6.9 05/04/2020   BILITOT 0.4 05/04/2020   ALT 11 05/04/2020   AST 15 05/04/2020   LIPID: Lab Results  Component Value Date   CHOL 189 05/04/2020   TRIG 104 05/04/2020   HDL 62 05/04/2020  LDLCALC 107 (H) 05/04/2020    Objective:  BP (!) 82/62 (BP Location: Left Arm, Patient Position: Sitting, Cuff Size: Normal)   Pulse (!) 51   Temp 97.8 F (36.6 C) (Oral)   Ht 5' 7.75" (1.721 m)   Wt 144 lb 1.6 oz (65.4 kg)   SpO2 98%   BMI 22.07 kg/m   Weight: 144 lb 1.6 oz (65.4 kg)   BP Readings from Last 3 Encounters:  06/10/21 (!) 82/62  06/01/20 120/80  05/04/20 102/70   Wt Readings from Last 3 Encounters:  06/10/21 144 lb 1.6 oz (65.4 kg)  06/01/20 139 lb 11.2 oz (63.4 kg)  05/04/20 140 lb 8 oz (63.7 kg)    Physical Exam Constitutional:      General: She is not in acute distress.    Appearance: She is  well-developed.  HENT:     Head: Normocephalic and atraumatic.     Right Ear: External ear normal.     Left Ear: External ear normal.     Mouth/Throat:     Pharynx: No oropharyngeal exudate.  Eyes:     Conjunctiva/sclera: Conjunctivae normal.     Pupils: Pupils are equal, round, and reactive to light.  Neck:     Thyroid: No thyromegaly.  Cardiovascular:     Rate and Rhythm: Normal rate and regular rhythm.     Heart sounds: Normal heart sounds. No murmur heard.   No friction rub. No gallop.  Pulmonary:     Effort: Pulmonary effort is normal.     Breath sounds: Normal breath sounds.  Abdominal:     General: Bowel sounds are normal. There is no distension.     Palpations: Abdomen is soft. There is no mass.     Tenderness: There is no abdominal tenderness. There is no guarding.     Hernia: No hernia is present.  Musculoskeletal:        General: No tenderness or deformity. Normal range of motion.     Cervical back: Normal range of motion and neck supple.  Lymphadenopathy:     Cervical: No cervical adenopathy.  Skin:    General: Skin is warm and dry.     Findings: No rash.  Neurological:     Mental Status: She is alert and oriented to person, place, and time.     Deep Tendon Reflexes: Reflexes normal.     Reflex Scores:      Tricep reflexes are 2+ on the right side and 2+ on the left side.      Bicep reflexes are 2+ on the right side and 2+ on the left side.      Brachioradialis reflexes are 2+ on the right side and 2+ on the left side.      Patellar reflexes are 2+ on the right side and 2+ on the left side. Psychiatric:        Speech: Speech normal.        Behavior: Behavior normal.        Thought Content: Thought content normal.    Assessment/Plan: Health Maintenance Due  Topic Date Due   INFLUENZA VACCINE  04/08/2021   Health Maintenance reviewed - due for mammogram, pap. She will schedule f/u with her gynecologist.  1. Preventative health care Keep up with active  lifestyle. Encouraged her to keep exploring other exercises that she may enjoy since running is causing intermittent ankle pain.   2. Moderate episode of recurrent major depressive disorder (HCC) She is going to decrease  Zoloft back to 50 mg daily.  She will let me know how she does with this.  If tolerating well and would like to decrease further, we will try her on 25 mg.  We discussed very slow taper off so she can monitor mood.  3. GAD (generalized anxiety disorder) See above.  Anxiety has been well controlled.  4. Seasonal allergies Very well controlled on Zyrtec and Flonase.  We discussed consideration for allergy referral should she desire.  5. History of anemia - CBC with Differential/Platelet; Future - CBC with Differential/Platelet  6. Lipid screening - Lipid panel; Future - Lipid panel  7. Screening for diabetes mellitus - Comprehensive metabolic panel; Future - Comprehensive metabolic panel   Return in about 1 year (around 06/10/2022) for physical exam. I will complete her wellness form once I get lab results.  Return in about 1 year (around 06/10/2022) for physical exam.  Theodis Shove, MD

## 2021-06-10 NOTE — Addendum Note (Signed)
Addended by: Johnella Moloney on: 06/10/2021 02:21 PM   Modules accepted: Orders

## 2021-06-27 MED ORDER — SERTRALINE HCL 50 MG PO TABS: 50.0000 mg | ORAL_TABLET | Freq: Every day | ORAL | 1 refills | Status: DC

## 2021-07-15 ENCOUNTER — Telehealth: Payer: Managed Care, Other (non HMO) | Admitting: Physician Assistant

## 2021-07-15 DIAGNOSIS — J208 Acute bronchitis due to other specified organisms: Secondary | ICD-10-CM | POA: Diagnosis not present

## 2021-07-15 DIAGNOSIS — B9689 Other specified bacterial agents as the cause of diseases classified elsewhere: Secondary | ICD-10-CM | POA: Diagnosis not present

## 2021-07-15 MED ORDER — AZITHROMYCIN 250 MG PO TABS: ORAL_TABLET | ORAL | 0 refills | Status: AC

## 2021-07-15 MED ORDER — BENZONATATE 100 MG PO CAPS: 100.0000 mg | ORAL_CAPSULE | Freq: Three times a day (TID) | ORAL | 0 refills | Status: DC | PRN

## 2021-07-15 NOTE — Patient Instructions (Addendum)
Clearnce Hasten, thank you for joining Piedad Climes, PA-C for today's virtual visit.  While this provider is not your primary care provider (PCP), if your PCP is located in our provider database this encounter information will be shared with them immediately following your visit.  Consent: (Patient) Terence Googe provided verbal consent for this virtual visit at the beginning of the encounter.  Current Medications:  Current Outpatient Medications:    cetirizine (ZYRTEC) 10 MG tablet, Take 10 mg by mouth daily., Disp: , Rfl:    fluticasone (FLONASE) 50 MCG/ACT nasal spray, Place 2 sprays into both nostrils daily., Disp: 16 g, Rfl: 3   sertraline (ZOLOFT) 50 MG tablet, Take 1 tablet (50 mg total) by mouth daily., Disp: 90 tablet, Rfl: 1   Medications ordered in this encounter:  No orders of the defined types were placed in this encounter.    *If you need refills on other medications prior to your next appointment, please contact your pharmacy*  Follow-Up: Call back or seek an in-person evaluation if the symptoms worsen or if the condition fails to improve as anticipated.  Other Instructions Take antibiotic (Azithromycin) as directed.  Increase fluids.  Get plenty of rest. Use Mucinex for congestion. Tessalon to help with cough. Take a daily probiotic (I recommend Align or Culturelle, but even Activia Yogurt may be beneficial).  A humidifier placed in the bedroom may offer some relief for a dry, scratchy throat of nasal irritation.  Read information below on acute bronchitis. Please call or return to clinic if symptoms are not improving.  Start OTC Vitamin D3 1000 units, Vitamin C 1000 mg daily, OTC zinc supplement.   Acute Bronchitis Bronchitis is when the airways that extend from the windpipe into the lungs get red, puffy, and painful (inflamed). Bronchitis often causes thick spit (mucus) to develop. This leads to a cough. A cough is the most common symptom of bronchitis. In  acute bronchitis, the condition usually begins suddenly and goes away over time (usually in 2 weeks). Smoking, allergies, and asthma can make bronchitis worse. Repeated episodes of bronchitis may cause more lung problems.  HOME CARE Rest. Drink enough fluids to keep your pee (urine) clear or pale yellow (unless you need to limit fluids as told by your doctor). Only take over-the-counter or prescription medicines as told by your doctor. Avoid smoking and secondhand smoke. These can make bronchitis worse. If you are a smoker, think about using nicotine gum or skin patches. Quitting smoking will help your lungs heal faster. Reduce the chance of getting bronchitis again by: Washing your hands often. Avoiding people with cold symptoms. Trying not to touch your hands to your mouth, nose, or eyes. Follow up with your doctor as told.  GET HELP IF: Your symptoms do not improve after 1 week of treatment. Symptoms include: Cough. Fever. Coughing up thick spit. Body aches. Chest congestion. Chills. Shortness of breath. Sore throat.  GET HELP RIGHT AWAY IF:  You have an increased fever. You have chills. You have severe shortness of breath. You have bloody thick spit (sputum). You throw up (vomit) often. You lose too much body fluid (dehydration). You have a severe headache. You faint.  MAKE SURE YOU:  Understand these instructions. Will watch your condition. Will get help right away if you are not doing well or get worse. Document Released: 02/11/2008 Document Revised: 04/27/2013 Document Reviewed: 02/15/2013 [facility] Patient Information 2015 [place], [place]. This information is not intended to replace advice given to you by your  health care provider. Make sure you discuss any questions you have with your health care provider.    If you have been instructed to have an in-person evaluation today at a local Urgent Care facility, please use the link below. It will take you to a list of all  of our available [facility], including address, phone number and hours of operation. Please do not delay care.  [facility]  If you or a family member do not have a primary care provider, use the link below to schedule a visit and establish care. When you choose a [facility] primary care physician or advanced practice provider, you gain a long-term partner in health. Find a Primary Care Provider  Learn more about [facility] in-office and virtual care options: [facility] - Get Care Now

## 2021-07-15 NOTE — Progress Notes (Signed)
Virtual Visit Consent   Holly Riddle, you are scheduled for a virtual visit with a [facility] provider today.     Just as with appointments in the office, your consent must be obtained to participate.  Your consent will be active for this visit and any virtual visit you may have with one of our providers in the next 365 days.     If you have a MyChart account, a copy of this consent can be sent to you electronically.  All virtual visits are billed to your insurance company just like a traditional visit in the office.    As this is a virtual visit, video technology does not allow for your provider to perform a traditional examination.  This may limit your provider's ability to fully assess your condition.  If your provider identifies any concerns that need to be evaluated in person or the need to arrange testing (such as labs, EKG, etc.), we will make arrangements to do so.     Although advances in technology are sophisticated, we cannot ensure that it will always work on either your end or our end.  If the connection with a video visit is poor, the visit may have to be switched to a telephone visit.  With either a video or telephone visit, we are not always able to ensure that we have a secure connection.     I need to obtain your verbal consent now.   Are you willing to proceed with your visit today?    Holly Riddle has provided verbal consent on 07/15/2021 for a virtual visit (video or telephone).   Piedad Climes, [place]   Date: 07/15/2021 8:21 AM   Virtual Visit via Video Note   I, Piedad Climes, connected with  Holly Riddle  ([id number], 04-17-1976) on 07/15/21 at  8:15 AM EST by a video-enabled telemedicine application and verified that I am speaking with the correct person using two identifiers.  Location: Patient: Virtual Visit Location Patient: Home Provider: Virtual Visit Location Provider: Home Office   I discussed the limitations of evaluation and management  by telemedicine and the availability of in person appointments. The patient expressed understanding and agreed to proceed.    History of Present Illness: Holly Riddle is a 45 y.o. who identifies as a female who was assigned female at birth, and is being seen today for > 1.5 weeks of URI symptoms starting with nasal congestion/head congestion, PND and cough. Was initially feeling better until this weekend when symptoms acutely worsened. Is noting more substantial cough with fatigue. Denies fever, chills, chest pain. Has taken Nyquil and Robitussin for symptoms. Has had negative COVID.   HPI: HPI  Problems:  Patient Active Problem List   Diagnosis Date Noted   Hip flexor tightness, right 04/22/2018   Nonallopathic lesion of lumbosacral region 04/22/2018   Nonallopathic lesion of thoracic region 04/22/2018   Right hip pain 03/26/2018   Nonallopathic lesion of sacral region 03/26/2018   Moderate episode of recurrent major depressive disorder (HCC) 12/17/2015   GAD (generalized anxiety disorder) 12/17/2015   Loss of transverse plantar arch of right foot 06/25/2015   Sesamoiditis 03/13/2015    Allergies: No Known Allergies Medications:  Current Outpatient Medications:    azithromycin (ZITHROMAX) 250 MG tablet, Take 2 tablets on day 1, then 1 tablet daily on days 2 through 5, Disp: 6 tablet, Rfl: 0   benzonatate (TESSALON) 100 MG capsule, Take 1 capsule (100 mg total) by mouth 3 (three) times  daily as needed for cough., Disp: 30 capsule, Rfl: 0   cetirizine (ZYRTEC) 10 MG tablet, Take 10 mg by mouth daily., Disp: , Rfl:    fluticasone (FLONASE) 50 MCG/ACT nasal spray, Place 2 sprays into both nostrils daily., Disp: 16 g, Rfl: 3   sertraline (ZOLOFT) 50 MG tablet, Take 1 tablet (50 mg total) by mouth daily., Disp: 90 tablet, Rfl: 1  Observations/Objective: Patient is well-developed, well-nourished in no acute distress.  Resting comfortably at home.  Head is normocephalic, atraumatic.  No  labored breathing. Speech is clear and coherent with logical content.  Patient is alert and oriented at baseline.   Assessment and Plan: 1. Acute bacterial bronchitis - azithromycin (ZITHROMAX) 250 MG tablet; Take 2 tablets on day 1, then 1 tablet daily on days 2 through 5  Dispense: 6 tablet; Refill: 0 - benzonatate (TESSALON) 100 MG capsule; Take 1 capsule (100 mg total) by mouth 3 (three) times daily as needed for cough.  Dispense: 30 capsule; Refill: 0 Rx Azithromycin.  Increase fluids.  Rest.  Saline nasal spray.  Probiotic.  Mucinex as directed.  Humidifier in bedroom. Tessalon per orders.  Call or return to clinic if symptoms are not improving.   Follow Up Instructions: I discussed the assessment and treatment plan with the patient. The patient was provided an opportunity to ask questions and all were answered. The patient agreed with the plan and demonstrated an understanding of the instructions.  A copy of instructions were sent to the patient via MyChart unless otherwise noted below.    The patient was advised to call back or seek an in-person evaluation if the symptoms worsen or if the condition fails to improve as anticipated.  Time:  I spent 12 minutes with the patient via telehealth technology discussing the above problems/concerns.    Piedad Climes, PA-C

## 2021-07-16 ENCOUNTER — Encounter: Payer: Self-pay | Admitting: Family Medicine

## 2021-07-17 ENCOUNTER — Telehealth: Payer: Managed Care, Other (non HMO) | Admitting: Family Medicine

## 2021-07-17 ENCOUNTER — Other Ambulatory Visit: Payer: Self-pay

## 2021-07-17 ENCOUNTER — Emergency Department
Admission: EM | Admit: 2021-07-17 | Discharge: 2021-07-17 | Disposition: A | Discharge status: Home / Self Care | Payer: Managed Care, Other (non HMO) | Source: Home / Self Care

## 2021-07-17 DIAGNOSIS — R059 Cough, unspecified: Secondary | ICD-10-CM | POA: Diagnosis not present

## 2021-07-17 DIAGNOSIS — J309 Allergic rhinitis, unspecified: Secondary | ICD-10-CM

## 2021-07-17 MED ORDER — FEXOFENADINE HCL 180 MG PO TABS: 180.0000 mg | ORAL_TABLET | Freq: Every day | ORAL | 0 refills | Status: DC

## 2021-07-17 MED ORDER — PROMETHAZINE-DM 6.25-15 MG/5ML PO SYRP: 5.0000 mL | ORAL_SOLUTION | Freq: Two times a day (BID) | ORAL | 0 refills | Status: DC | PRN

## 2021-07-17 MED ORDER — DOXYCYCLINE HYCLATE 100 MG PO CAPS: 100.0000 mg | ORAL_CAPSULE | Freq: Two times a day (BID) | ORAL | 0 refills | Status: AC

## 2021-07-17 MED ORDER — PREDNISONE 20 MG PO TABS
ORAL_TABLET | ORAL | 0 refills | Status: DC
Start: 2021-07-17 — End: 2021-07-29

## 2021-07-17 NOTE — Discharge Instructions (Addendum)
Advised mother to take medication with food to completion.  Advised mother may take prednisone burst and Allegra with first dose of antibiotic for 5 of 7 days, then as needed for concurrent postnasal drainage/drip.  Encouraged Mother to increase daily water intake while taking these medications.

## 2021-07-17 NOTE — ED Triage Notes (Signed)
Pt c/o cold sxs since 10/27. Fever started Sunday. Covid test last week neg. Delsym, motrin and nyquil prn. Telehealth on Monday, currently taking zpak and tessalon. (2 days worth)

## 2021-07-17 NOTE — ED Provider Notes (Signed)
[facility]    CSN: [id number] Arrival date & time: 07/17/21  0930      History   Chief Complaint Chief Complaint  Patient presents with   Cough   Fever   Nasal Congestion    HPI Holly Riddle is a 45 y.o. female.   HPI 45 year old female presents with cold symptoms since 07/04/2021.  Reports fever unquantified started on Sunday.  Reports negative COVID test last Monday.  Reports telehealth on Monday (2 days ago) currently taking Z-Pak and Tessalon Perles.  Past Medical History:  Diagnosis Date   Allergy    Depression     Patient Active Problem List   Diagnosis Date Noted   Hip flexor tightness, right 04/22/2018   Nonallopathic lesion of lumbosacral region 04/22/2018   Nonallopathic lesion of thoracic region 04/22/2018   Right hip pain 03/26/2018   Nonallopathic lesion of sacral region 03/26/2018   Moderate episode of recurrent major depressive disorder (HCC) 12/17/2015   GAD (generalized anxiety disorder) 12/17/2015   Loss of transverse plantar arch of right foot 06/25/2015   Sesamoiditis 03/13/2015    Past Surgical History:  Procedure Laterality Date   ABLATION  03/2019    OB History   No obstetric history on file.      Home Medications    Prior to Admission medications   Medication Sig Start Date End Date Taking? Authorizing Provider  doxycycline (VIBRAMYCIN) 100 MG capsule Take 1 capsule (100 mg total) by mouth 2 (two) times daily for 7 days. 07/17/21 07/24/21 Yes Trevor Iha, FNP  predniSONE (DELTASONE) 20 MG tablet Take 3 tabs PO daily x 5 days. 07/17/21  Yes Trevor Iha, FNP  promethazine-dextromethorphan (PROMETHAZINE-DM) 6.25-15 MG/5ML syrup Take 5 mLs by mouth 2 (two) times daily as needed for cough. 07/17/21  Yes Trevor Iha, FNP  azithromycin (ZITHROMAX) 250 MG tablet Take 2 tablets on day 1, then 1 tablet daily on days 2 through 5 07/15/21 07/20/21  Waldon Merl, PA-C  benzonatate (TESSALON) 100 MG capsule Take 1  capsule (100 mg total) by mouth 3 (three) times daily as needed for cough. 07/15/21   Waldon Merl, PA-C  cetirizine (ZYRTEC) 10 MG tablet Take 10 mg by mouth daily.    [provider]  fluticasone (FLONASE) 50 MCG/ACT nasal spray Place 2 sprays into both nostrils daily. 10/30/14   Terressa Koyanagi, DO  sertraline (ZOLOFT) 50 MG tablet Take 1 tablet (50 mg total) by mouth daily. 06/27/21   Wynn Banker, MD    Family History Family History  Problem Relation Age of Onset   Alcohol abuse Father    Hyperlipidemia Father    Heart disease Father 63   Hypertension Father    Cancer Maternal Grandmother 35       breast   Cancer Maternal Grandfather        lung   Diabetes Maternal Grandfather    Arthritis Paternal Grandmother    Diabetes Paternal Grandmother    Mental illness Paternal Grandmother    Healthy Brother     Social History Social History   Tobacco Use   Smoking status: Never   Smokeless tobacco: Never  Substance Use Topics   Alcohol use: Yes    Alcohol/week: 1.0 standard drink    Types: 1 Glasses of wine per week   Drug use: No     Allergies   Patient has no known allergies.   Review of Systems Review of Systems  HENT:  Positive for congestion.  Respiratory:  Positive for cough.   All other systems reviewed and are negative.   Physical Exam Triage Vital Signs ED Triage Vitals  Enc Vitals Group     BP 07/17/21 0950 102/70     Pulse Rate 07/17/21 0950 87     Resp 07/17/21 0950 16     Temp 07/17/21 0950 98.5 F (36.9 C)     Temp Source 07/17/21 0950 Oral     SpO2 07/17/21 0950 99 %     Weight --      Height --      Head Circumference --      Peak Flow --      Pain Score 07/17/21 0946 0     Pain Loc --      Pain Edu? --      Excl. in GC? --    No data found.  Updated Vital Signs BP 102/70 (BP Location: Right Arm)   Pulse 87   Temp 98.5 F (36.9 C) (Oral)   Resp 16   SpO2 99%    Physical Exam Vitals and nursing note  reviewed.  Constitutional:      General: She is not in acute distress.    Appearance: Normal appearance. She is normal weight. She is not ill-appearing.  HENT:     Head: Normocephalic and atraumatic.     Right Ear: Tympanic membrane, ear canal and external ear normal.     Left Ear: Tympanic membrane, ear canal and external ear normal.     Mouth/Throat:     Mouth: Mucous membranes are moist.     Pharynx: Oropharynx is clear.  Eyes:     Extraocular Movements: Extraocular movements intact.     Conjunctiva/sclera: Conjunctivae normal.     Pupils: Pupils are equal, round, and reactive to light.  Cardiovascular:     Rate and Rhythm: Normal rate and regular rhythm.     Pulses: Normal pulses.     Heart sounds: Normal heart sounds.  Pulmonary:     Effort: Pulmonary effort is normal.     Breath sounds: Normal breath sounds.  Musculoskeletal:        General: Normal range of motion.     Cervical back: Normal range of motion and neck supple.  Skin:    General: Skin is warm and dry.  Neurological:     General: No focal deficit present.     Mental Status: She is alert and oriented to person, place, and time.     UC Treatments / Results  Labs (all labs ordered are listed, but only abnormal results are displayed) Labs Reviewed - No data to display  EKG   Radiology No results found.  Procedures Procedures (including critical care time)  Medications Ordered in UC Medications - No data to display  Initial Impression / Assessment and Plan / UC Course  I have reviewed the triage vital signs and the nursing notes.  Pertinent labs & imaging results that were available during my care of the patient were reviewed by me and considered in my medical decision making (see chart for details).     MDM: 1.  Cough-currently prescribed Zithromax and Tessalon Perles from televisit on 07/15/2021.  Encouraged patient to complete these medications today.  Rx'd doxycycline, prednisone, Promethazine  DM; 2.  Allergic rhinitis-Rx'd Allegra Final Clinical Impressions(s) / UC Diagnoses   Final diagnoses:  Cough, unspecified type  Allergic rhinitis, unspecified seasonality, unspecified trigger     Discharge Instructions  Advised mother to take medication with food to completion.  Advised mother may take prednisone burst and Allegra with first dose of antibiotic for 5 of 7 days, then as needed for concurrent postnasal drainage/drip.  Encouraged mother to increase daily water intake while taking these medications.     ED Prescriptions     Medication Sig Dispense Auth. Provider   doxycycline (VIBRAMYCIN) 100 MG capsule Take 1 capsule (100 mg total) by mouth 2 (two) times daily for 7 days. 14 capsule Trevor Iha, FNP   predniSONE (DELTASONE) 20 MG tablet Take 3 tabs PO daily x 5 days. 15 tablet Trevor Iha, FNP   promethazine-dextromethorphan (PROMETHAZINE-DM) 6.25-15 MG/5ML syrup Take 5 mLs by mouth 2 (two) times daily as needed for cough. 118 mL Trevor Iha, FNP      PDMP not reviewed this encounter.   Trevor Iha, FNP 07/17/21 1051

## 2021-07-18 ENCOUNTER — Ambulatory Visit: Payer: Managed Care, Other (non HMO) | Admitting: Adult Health

## 2021-07-29 ENCOUNTER — Other Ambulatory Visit: Payer: Self-pay

## 2021-07-29 ENCOUNTER — Ambulatory Visit (INDEPENDENT_AMBULATORY_CARE_PROVIDER_SITE_OTHER): Payer: Managed Care, Other (non HMO)

## 2021-07-29 ENCOUNTER — Ambulatory Visit: Payer: Managed Care, Other (non HMO) | Admitting: Family Medicine

## 2021-07-29 ENCOUNTER — Encounter: Payer: Self-pay | Admitting: Family Medicine

## 2021-07-29 VITALS — BP 110/58 | HR 76 | Temp 97.5°F | Ht 67.75 in | Wt 147.0 lb

## 2021-07-29 DIAGNOSIS — R5383 Other fatigue: Secondary | ICD-10-CM

## 2021-07-29 DIAGNOSIS — R079 Chest pain, unspecified: Secondary | ICD-10-CM | POA: Diagnosis not present

## 2021-07-29 DIAGNOSIS — R059 Cough, unspecified: Secondary | ICD-10-CM

## 2021-07-29 LAB — CBC WITH DIFFERENTIAL/PLATELET
Basophils Absolute: 0 10*3/uL (ref 0.0–0.1)
Basophils Relative: 0.2 % (ref 0.0–3.0)
Eosinophils Absolute: 0.1 10*3/uL (ref 0.0–0.7)
Eosinophils Relative: 1.1 % (ref 0.0–5.0)
HCT: 43.5 % (ref 36.0–46.0)
Hemoglobin: 14.8 g/dL (ref 12.0–15.0)
Lymphocytes Relative: 26.7 % (ref 12.0–46.0)
Lymphs Abs: 2.9 10*3/uL (ref 0.7–4.0)
MCHC: 34 g/dL (ref 30.0–36.0)
MCV: 91.3 fl (ref 78.0–100.0)
Monocytes Absolute: 0.9 10*3/uL (ref 0.1–1.0)
Monocytes Relative: 8.8 % (ref 3.0–12.0)
Neutro Abs: 6.8 10*3/uL (ref 1.4–7.7)
Neutrophils Relative %: 63.2 % (ref 43.0–77.0)
Platelets: 272 10*3/uL (ref 150.0–400.0)
RBC: 4.76 Mil/uL (ref 3.87–5.11)
RDW: 12.2 % (ref 11.5–15.5)
WBC: 10.7 10*3/uL — ABNORMAL HIGH (ref 4.0–10.5)

## 2021-07-29 LAB — COMPREHENSIVE METABOLIC PANEL
ALT: 14 U/L (ref 0–35)
AST: 17 U/L (ref 0–37)
Albumin: 4.4 g/dL (ref 3.5–5.2)
Alkaline Phosphatase: 79 U/L (ref 39–117)
BUN: 11 mg/dL (ref 6–23)
CO2: 28 mEq/L (ref 19–32)
Calcium: 9.2 mg/dL (ref 8.4–10.5)
Chloride: 103 mEq/L (ref 96–112)
Creatinine, Ser: 0.9 mg/dL (ref 0.40–1.20)
GFR: 78.4 mL/min (ref >60.00)
Glucose, Bld: 109 mg/dL — ABNORMAL HIGH (ref 70–99)
Potassium: 3.4 mEq/L — ABNORMAL LOW (ref 3.5–5.1)
Sodium: 139 mEq/L (ref 135–145)
Total Bilirubin: 0.6 mg/dL (ref 0.2–1.2)
Total Protein: 7.1 g/dL (ref 6.0–8.3)

## 2021-07-29 LAB — SEDIMENTATION RATE: Sed Rate: 8 mm/hr (ref 0–20)

## 2021-07-29 MED ORDER — ALBUTEROL SULFATE (2.5 MG/3ML) 0.083% IN NEBU
2.5000 mg | INHALATION_SOLUTION | Freq: Once | RESPIRATORY_TRACT | Status: AC
  Administered 2021-07-29: 2.5 mg via RESPIRATORY_TRACT

## 2021-07-29 MED ORDER — METHYLPREDNISOLONE ACETATE 80 MG/ML IJ SUSP
80.0000 mg | Freq: Once | INTRAMUSCULAR | Status: AC
Start: 2021-07-29 — End: 2021-07-29
  Administered 2021-07-29: 80 mg via INTRAMUSCULAR

## 2021-07-29 NOTE — Progress Notes (Signed)
Holly Riddle DOB: [birth year] Encounter date: 07/29/2021  This is a 45 y.o. female who presents with Chief Complaint  Patient presents with   Chest Pain    X2 weeks   Fatigue    History of present illness:  Son was tested and had flu, so suspects this is what whole family got. Was sick for 2 weeks and then put on antibiotics. Cough was so severe. Just couldn't even sleep for a couple of weeks. Then had another round of antibiotics from urgent care with zpack and tessalon perles. Urgent care visit 11/9.   Chest hurts when she lays down, sitting up, coughing. Always uncomfortable unless standing. Activity promotes cough, so most she can do is walk dog. Taking ibuprofen for chest discomfort - taking 2 pills BID. Was also taking during flu.   Doesn't feel short of breath, but coughs and it hurts with deep breath. Cough is sometimes productive. Sometimes has to work for a spell before getting something up. Does tend to get bad cough with cold.   First day of illness was 10/26.   Fatigue is still significant. Having to really sit and rest after any activity. Sleeping better now that she isn't coughing as much through the night. Usually tired after morning.   Appetite is ok - she is eating and drinking well now. Did have a short span without appetite.   Heart not racing or skipping beats.   She is still taking flonase and zyrtec.   Last fever was close to last urgent care visit. Didn't have fever for first week and a half she was sick.   Not short of breath with walking; more just cough. Pain in chest does not change with changes in position.   No Known Allergies Current Meds  Medication Sig   cetirizine (ZYRTEC) 10 MG tablet Take 10 mg by mouth daily.   fluticasone (FLONASE) 50 MCG/ACT nasal spray Place 2 sprays into both nostrils daily.   sertraline (ZOLOFT) 50 MG tablet Take 1 tablet (50 mg total) by mouth daily.    Review of Systems  Constitutional:  Positive for fatigue.  Negative for chills and fever.  HENT:  Negative for congestion, ear pain, postnasal drip, rhinorrhea, sinus pressure and sinus pain.   Respiratory:  Positive for cough. Negative for chest tightness, shortness of breath and wheezing.   Cardiovascular:  Positive for chest pain. Negative for palpitations and leg swelling.   Objective:  BP (!) 110/58 (BP Location: Right Arm, Patient Position: Sitting, Cuff Size: Normal)   Pulse 76   Temp (!) 97.5 F (36.4 C) (Oral)   Ht 5' 7.75" (1.721 m)   Wt 147 lb (66.7 kg)   SpO2 99%   BMI 22.52 kg/m   Weight: 147 lb (66.7 kg)   BP Readings from Last 3 Encounters:  07/29/21 (!) 110/58  07/17/21 102/70  06/10/21 (!) 82/62   Wt Readings from Last 3 Encounters:  07/29/21 147 lb (66.7 kg)  06/10/21 144 lb 1.6 oz (65.4 kg)  06/01/20 139 lb 11.2 oz (63.4 kg)    Physical Exam Constitutional:      General: She is not in acute distress.    Appearance: She is well-developed.  Cardiovascular:     Rate and Rhythm: Normal rate and regular rhythm.     Heart sounds: Normal heart sounds. No murmur heard.   No friction rub.  Pulmonary:     Effort: Pulmonary effort is normal. No respiratory distress.     Breath sounds:  Normal breath sounds. Decreased air movement: did have improvement in air movement after neb. No wheezing, rhonchi or rales.  Chest:     Comments: Tenderness to palpation left chest/ribs anterior.  Musculoskeletal:     Right lower leg: No edema.     Left lower leg: No edema.  Neurological:     Mental Status: She is alert and oriented to person, place, and time.  Psychiatric:        Behavior: Behavior normal.    Assessment/Plan 1. Chest pain, unspecified type Isolated to left side. Suspect msk from cough, but due to fatigue, continued sx, ekg also obtained. Normal, sinus rhythm, no acute changes. Depo given today; consider prednisone burst pending imaging.  - Sedimentation rate; Future - methylPREDNISolone acetate (DEPO-MEDROL)  injection 80 mg - EKG 12-Lead  2. Cough, unspecified type Post-viral cough. Lingering. Improved air exchange after neb. Consider prednisone. Will check in once I have lab results to determine next step. Consider albuterol inhaler short term if she notes improvement with cough today.  - CBC with Differential/Platelet; Future - DG Chest 2 View; Future - albuterol (PROVENTIL) (2.5 MG/3ML) 0.083% nebulizer solution 2.5 mg  3. Other fatigue Labwork today. Follow up pending above. - Comprehensive metabolic panel; Future   Return if symptoms worsen or fail to improve.  30 minutes spent in chart review, time with patient, re-eval after neb, follow up plan.   Theodis Shove, MD

## 2021-07-30 ENCOUNTER — Telehealth: Payer: Self-pay

## 2021-07-30 DIAGNOSIS — R911 Solitary pulmonary nodule: Secondary | ICD-10-CM

## 2021-07-30 NOTE — Telephone Encounter (Signed)
Patient informed of the results and is aware someone will call with appt info for the CT scan.

## 2021-07-30 NOTE — Telephone Encounter (Signed)
Let patient know I have ordered noncontrast CT lung as per radiology recommendation to evaluate 8 mm nodular opacity apical right lung noted on recent chest x-ray.

## 2021-07-30 NOTE — Telephone Encounter (Signed)
Radiology called to give results Vague nodular opacity apical right lung Recommending further evaluation noncontrast chest CT

## 2021-07-30 NOTE — Addendum Note (Signed)
Addended by: Kristian Covey on: 07/30/2021 12:34 PM   Modules accepted: Orders

## 2021-08-02 ENCOUNTER — Other Ambulatory Visit: Payer: Self-pay | Admitting: Family Medicine

## 2021-08-02 DIAGNOSIS — R911 Solitary pulmonary nodule: Secondary | ICD-10-CM

## 2021-08-02 DIAGNOSIS — R059 Cough, unspecified: Secondary | ICD-10-CM

## 2021-08-22 ENCOUNTER — Other Ambulatory Visit: Payer: Self-pay

## 2021-08-22 ENCOUNTER — Ambulatory Visit
Admission: RE | Admit: 2021-08-22 | Discharge: 2021-08-22 | Disposition: A | Discharge status: Home / Self Care | Payer: Managed Care, Other (non HMO) | Source: Ambulatory Visit | Attending: Family Medicine | Admitting: Family Medicine

## 2021-08-22 DIAGNOSIS — R911 Solitary pulmonary nodule: Secondary | ICD-10-CM

## 2021-08-26 ENCOUNTER — Telehealth: Payer: Self-pay | Admitting: Family Medicine

## 2021-08-26 NOTE — Telephone Encounter (Signed)
Patient stated that she received a phone call regarding her lab test results. Patient is requesting a phone call back.  Patient could be contacted at [phone].  Please advise.

## 2021-08-26 NOTE — Telephone Encounter (Signed)
See result notes. 

## 2022-01-15 ENCOUNTER — Other Ambulatory Visit: Payer: Self-pay | Admitting: Family Medicine

## 2022-03-24 LAB — HM MAMMOGRAPHY

## 2022-03-24 LAB — HM PAP SMEAR

## 2022-06-11 ENCOUNTER — Ambulatory Visit: Payer: Managed Care, Other (non HMO) | Admitting: Family Medicine

## 2022-06-11 ENCOUNTER — Encounter: Payer: Self-pay | Admitting: Family Medicine

## 2022-06-11 ENCOUNTER — Encounter: Payer: Managed Care, Other (non HMO) | Admitting: Family Medicine

## 2022-06-11 VITALS — BP 110/68 | HR 65 | Temp 98.1°F | Ht 67.75 in | Wt 144.6 lb

## 2022-06-11 DIAGNOSIS — Z13220 Encounter for screening for lipoid disorders: Secondary | ICD-10-CM

## 2022-06-11 DIAGNOSIS — F331 Major depressive disorder, recurrent, moderate: Secondary | ICD-10-CM

## 2022-06-11 DIAGNOSIS — Z0000 Encounter for general adult medical examination without abnormal findings: Secondary | ICD-10-CM

## 2022-06-11 DIAGNOSIS — Z23 Encounter for immunization: Secondary | ICD-10-CM

## 2022-06-11 LAB — LIPID PANEL
Cholesterol: 160 mg/dL (ref 0–200)
HDL: 54.4 mg/dL (ref >39.00)
LDL Cholesterol: 82 mg/dL (ref 0–99)
NonHDL: 105.92
Total CHOL/HDL Ratio: 3
Triglycerides: 118 mg/dL (ref 0.0–149.0)
VLDL: 23.6 mg/dL (ref 0.0–40.0)

## 2022-06-11 LAB — COMPREHENSIVE METABOLIC PANEL
ALT: 13 U/L (ref 0–35)
AST: 17 U/L (ref 0–37)
Albumin: 4.5 g/dL (ref 3.5–5.2)
Alkaline Phosphatase: 67 U/L (ref 39–117)
BUN: 14 mg/dL (ref 6–23)
CO2: 30 mEq/L (ref 19–32)
Calcium: 9.6 mg/dL (ref 8.4–10.5)
Chloride: 103 mEq/L (ref 96–112)
Creatinine, Ser: 0.86 mg/dL (ref 0.40–1.20)
GFR: 82.29 mL/min (ref >60.00)
Glucose, Bld: 89 mg/dL (ref 70–99)
Potassium: 4.4 mEq/L (ref 3.5–5.1)
Sodium: 139 mEq/L (ref 135–145)
Total Bilirubin: 0.5 mg/dL (ref 0.2–1.2)
Total Protein: 7.2 g/dL (ref 6.0–8.3)

## 2022-06-11 LAB — TSH: TSH: 3.11 u[IU]/mL (ref 0.35–5.50)

## 2022-06-11 NOTE — Progress Notes (Signed)
Complete physical exam  Patient: Holly Riddle   DOB: [birth year]   46 y.o. Female  MRN: [id number]  Subjective:    Chief Complaint  Patient presents with   [place] is a 46 y.o. female who presents today for a complete physical exam. She reports consuming a general diet. Home exercise routine includes regular walking. She generally feels well. She reports sleeping well. She does not have additional problems to discuss today.   She reports no new symptoms or issues today. States that she stopped taking the sertraline a few months ago. States that her mood symptoms have resolved and she was feeling good so she stopped the medication. She denies any recurrence of her symptoms, denies anxiety.   Most recent fall risk assessment:     No data to display           Most recent depression screenings:    06/11/2022   11:24 AM 07/29/2021    9:27 AM  PHQ 2/9 Scores  PHQ - 2 Score 0 2  PHQ- 9 Score 1     Dental: No current dental problems  Patient Active Problem List   Diagnosis Date Noted   Hip flexor tightness, right 04/22/2018   Nonallopathic lesion of lumbosacral region 04/22/2018   Nonallopathic lesion of thoracic region 04/22/2018   Right hip pain 03/26/2018   Nonallopathic lesion of sacral region 03/26/2018   Moderate episode of recurrent major depressive disorder ([place]) 12/17/2015   GAD (generalized anxiety disorder) 12/17/2015   Loss of transverse plantar arch of right foot 06/25/2015   Sesamoiditis 03/13/2015      Patient Care Team: Farrel Conners, MD as PCP - General (Family Medicine) Marylynn Pearson, MD as Attending Physician (Obstetrics and Gynecology)   Outpatient Medications Prior to Visit  Medication Sig   cetirizine (ZYRTEC) 10 MG tablet Take 10 mg by mouth daily.   fluticasone (FLONASE) 50 MCG/ACT nasal spray Place 2 sprays into both nostrils daily.   [DISCONTINUED] sertraline (ZOLOFT) 50 MG tablet Take 1 tablet by mouth  once daily   No facility-administered medications prior to visit.    Review of Systems  HENT:  Negative for hearing loss.   Eyes:  Negative for blurred vision.  Respiratory:  Negative for shortness of breath.   Cardiovascular:  Negative for chest pain.  Gastrointestinal: Negative.   Genitourinary: Negative.   Musculoskeletal:  Negative for back pain.  Neurological:  Negative for headaches.  Psychiatric/Behavioral:  Negative for depression.           Objective:     BP 110/68 (BP Location: Left Arm, Patient Position: Sitting, Cuff Size: Normal)   Pulse 65   Temp 98.1 F (36.7 C) (Oral)   Ht 5' 7.75" (1.721 m)   Wt 144 lb 9.6 oz (65.6 kg)   SpO2 98%   BMI 22.15 kg/m  BP Readings from Last 3 Encounters:  06/11/22 110/68  07/29/21 (!) 110/58  07/17/21 102/70   Wt Readings from Last 3 Encounters:  06/11/22 144 lb 9.6 oz (65.6 kg)  07/29/21 147 lb (66.7 kg)  06/10/21 144 lb 1.6 oz (65.4 kg)      Physical Exam Vitals reviewed.  Constitutional:      Appearance: Normal appearance. She is well-groomed and normal weight.  HENT:     Head: Normocephalic and atraumatic.     Right Ear: Tympanic membrane normal.     Left Ear: Tympanic membrane normal.     Mouth/Throat:  Mouth: Mucous membranes are moist.     Pharynx: Oropharynx is clear.  Eyes:     Extraocular Movements: Extraocular movements intact.     Conjunctiva/sclera: Conjunctivae normal.     Pupils: Pupils are equal, round, and reactive to light.  Neck:     Thyroid: No thyromegaly.  Cardiovascular:     Rate and Rhythm: Normal rate and regular rhythm.     Pulses: Normal pulses.     Heart sounds: S1 normal and S2 normal.  Pulmonary:     Effort: Pulmonary effort is normal.     Breath sounds: Normal breath sounds and air entry.  Abdominal:     General: Abdomen is flat. Bowel sounds are normal.     Palpations: Abdomen is soft.  Musculoskeletal:        General: Normal range of motion.     Cervical back:  Normal range of motion.     Right lower leg: No edema.     Left lower leg: No edema.  Skin:    General: Skin is warm and dry.  Neurological:     Mental Status: She is alert and oriented to person, place, and time. Mental status is at baseline.     Gait: Gait is intact.  Psychiatric:        Mood and Affect: Mood and affect normal.        Speech: Speech normal.        Behavior: Behavior normal.        Judgment: Judgment normal.      No results found for any visits on 06/11/22.     Assessment & Plan:    Routine Health Maintenance and Physical Exam  Immunization History  Administered Date(s) Administered   Influenza,inj,Quad PF,6+ Mos 06/01/2020, 06/10/2021, 06/11/2022   Moderna Sars-Covid-2 Vaccination 11/25/2019, 12/27/2019, 08/08/2020   Tdap 02/06/2009, 04/01/2019    Health Maintenance  Topic Date Due   COVID-19 Vaccine (4 - Moderna risk series) 06/27/2022 (Originally 10/03/2020)   HIV Screening  06/12/2023 (Originally 04/01/1993)   PAP SMEAR-Modifier  04/08/2025   TETANUS/TDAP  03/31/2029   INFLUENZA VACCINE  Completed   Hepatitis C Screening  Completed   HPV VACCINES  Aged Out    Discussed health benefits of physical activity, and encouraged her to engage in regular exercise appropriate for her age and condition.  Problem List Items Addressed This Visit       Other   Moderate episode of recurrent major depressive disorder ([place])    In remission, stopped sertraline, will continue to monitor her for symptoms, pt is to call us if her symptoms recur.      Relevant Orders   TSH   Other Visit Diagnoses     Need for immunization against influenza    -  Primary   Relevant Orders   Flu Vaccine QUAD 6+ mos PF IM (Fluarix Quad PF) (Completed)   Lipid screening       Relevant Orders   Lipid Panel   Routine physical examination       Relevant Orders   CMP     Pt gave me a form to fill out for her work. I have ordered her annual bloodwork, will fill out papers when  she blood work has resulted. Return in about 1 year (around 06/12/2023) for Annual physical exam .     Farrel Conners, MD

## 2022-06-11 NOTE — Assessment & Plan Note (Signed)
In remission, stopped sertraline, will continue to monitor her for symptoms, pt is to call us if her symptoms recur.

## 2022-06-12 NOTE — Progress Notes (Signed)
All labs are normal. 

## 2022-06-17 ENCOUNTER — Encounter: Payer: Self-pay | Admitting: Family Medicine

## 2022-10-23 ENCOUNTER — Encounter: Payer: Self-pay | Admitting: Family Medicine

## 2022-10-23 ENCOUNTER — Ambulatory Visit: Payer: Self-pay | Admitting: Family Medicine

## 2022-10-23 ENCOUNTER — Ambulatory Visit (INDEPENDENT_AMBULATORY_CARE_PROVIDER_SITE_OTHER): Payer: 59 | Admitting: Family Medicine

## 2022-10-23 VITALS — BP 100/68 | HR 72 | Temp 97.6°F | Ht 67.75 in | Wt 141.2 lb

## 2022-10-23 DIAGNOSIS — G4700 Insomnia, unspecified: Secondary | ICD-10-CM | POA: Diagnosis not present

## 2022-10-23 DIAGNOSIS — R002 Palpitations: Secondary | ICD-10-CM

## 2022-10-23 MED ORDER — TRAZODONE HCL 50 MG PO TABS: 25.0000 mg | ORAL_TABLET | Freq: Every evening | ORAL | 3 refills | Status: DC | PRN

## 2022-10-23 NOTE — Progress Notes (Signed)
Acute Office Visit  Subjective:     Patient ID: Holly Riddle, female    DOB: May 17, 1976, 47 y.o.   MRN: [id number]  Chief Complaint  Patient presents with   Insomnia    X2 weeks, tried Unisom with some relief, states she is only getting approximately 3-4 hours of sleep, has tried yoga also   Palpitations    X2 weeks    Patient is here to discuss her new onset insomnia, states that it has just been going on for the past 2 weeks. States that she is working with her counselor to work on some issues, states that her husband does snore at night but she is used to that. Cannot seem to pinpoint the reason why she is having trouble. States that when she lays down to sleep she has trouble "turning off her thoughts" to fall asleep, has been taking at least an hour to fall asleep. Has tried OTC Unisom, also tried magnesium gluconate without much improvement.   Palpitations  This is a new problem. The current episode started 1 to 4 weeks ago. The problem occurs intermittently. On average, each episode lasts 10 seconds. The symptoms are aggravated by stress. Pertinent negatives include no chest pain, dizziness, near-syncope or shortness of breath. She has tried nothing for the symptoms. Risk factors include stress. Her past medical history is significant for anxiety.   Patient is in today for pt reports she doesn't drink any caffeine past lunch, states that it is very random but also feels a "buzzing" in her chest when the palpitations are occurring. Reports she stopped the sertraline several months ago because of the decreased libido side effect she was experiencing. We had a long discussion about other antidepressants such as mirtazepine and pristiq that are not quite as bad with the decreased libido. Pt states she wants to try the trazodone at night first.   Review of Systems  Respiratory:  Negative for shortness of breath.   Cardiovascular:  Negative for chest pain and near-syncope.   Neurological:  Negative for dizziness.  All other systems reviewed and are negative.       Objective:    BP 100/68 (BP Location: Left Arm, Patient Position: Sitting, Cuff Size: Normal)   Pulse 72   Temp 97.6 F (36.4 C) (Oral)   Ht 5' 7.75" (1.721 m)   Wt 141 lb 3.2 oz (64 kg)   SpO2 100%   BMI 21.63 kg/m    Physical Exam Vitals reviewed.  Constitutional:      Appearance: Normal appearance. She is normal weight.  Eyes:     Conjunctiva/sclera: Conjunctivae normal.  Cardiovascular:     Rate and Rhythm: Normal rate and regular rhythm.     Pulses: Normal pulses.     Heart sounds: Normal heart sounds. No murmur heard. Pulmonary:     Effort: Pulmonary effort is normal.     Breath sounds: Normal breath sounds. No wheezing or rhonchi.  Abdominal:     General: Bowel sounds are normal.  Skin:    General: Skin is warm and dry.  Neurological:     Mental Status: She is alert and oriented to person, place, and time. Mental status is at baseline.  Psychiatric:        Mood and Affect: Mood normal.        Behavior: Behavior normal.        Thought Content: Thought content normal.        Judgment: Judgment normal.   EKG:  there are no previous tracings available for comparison, sinus bradycardia. HR of 55, normal P waves, good R wave progression, no PAC's or PVC's, normal QT interval ST/T wave morphology is normal.  Otherwise a normal EKG reading today.   No results found for any visits on 10/23/22.      Assessment & Plan:   Problem List Items Addressed This Visit   None Visit Diagnoses     Heart palpitations    -  Primary  EKG was performed in office and interpreted. The is a mild sinus bradycardia but no other abnormalities seen today. I advised that we correct her sleep first and then if the palpitations are persistent then we should order a 30 day event monitor to look for transient arrhythmias.  Pt is agreeable to this plan and will message me on Mychart.    Relevant  Orders   EKG 12-Lead (Completed)   Acute insomnia       Relevant Medications   New onset. Most likely anxiety related, after a long discussion will have her try trazodone 25-50 mg daily at bedtime. I gave her handouts on sleep hygiene as well. Pt is to contact me via MyChart to give me an update on her symptoms. traZODone (DESYREL) 50 MG tablet       Meds ordered this encounter  Medications   traZODone (DESYREL) 50 MG tablet    Sig: Take 0.5-1 tablets (25-50 mg total) by mouth at bedtime as needed for sleep.    Dispense:  30 tablet    Refill:  3    No follow-ups on file.  Farrel Conners, MD

## 2022-10-23 NOTE — Patient Instructions (Signed)
Message Dr. Legrand Como in 1 month on My Chart to give an update on the insomnia

## 2022-10-31 ENCOUNTER — Encounter: Payer: Self-pay | Admitting: Family Medicine

## 2022-10-31 DIAGNOSIS — F411 Generalized anxiety disorder: Secondary | ICD-10-CM

## 2022-10-31 MED ORDER — SERTRALINE HCL 50 MG PO TABS: 50.0000 mg | ORAL_TABLET | Freq: Every day | ORAL | 1 refills | Status: DC

## 2022-12-11 ENCOUNTER — Ambulatory Visit (INDEPENDENT_AMBULATORY_CARE_PROVIDER_SITE_OTHER): Payer: 59 | Admitting: Family Medicine

## 2022-12-11 VITALS — BP 116/80 | HR 55 | Temp 97.8°F | Ht 67.75 in | Wt 139.3 lb

## 2022-12-11 DIAGNOSIS — R202 Paresthesia of skin: Secondary | ICD-10-CM

## 2022-12-11 NOTE — Progress Notes (Signed)
   Acute Office Visit  Subjective:     Patient ID: Holly Riddle, female    DOB: 11-08-1975, 47 y.o.   MRN: [id number]  Chief Complaint  Patient presents with   Wrist Pain    Patient complains of bilateral wrist pain, tingling and numbness noted x3 months, no known injury; however noticed after a lot painting at her house    Wrist Pain    Patient is in today for wrist pain and numbness/tingling in her hands. She was doing a lot of painting in her home when the symptoms started. States that it has persistent, numbness mostly at nightttime. Is wearing wrist brace at night, however it sometimes helps and sometimes does not. She is a side sleeper and has bene trying to keep her wrists stable at night. Does not have a wrist splint for her left wrist.   Review of Systems  All other systems reviewed and are negative.       Objective:    BP 116/80 (BP Location: Right Arm, Patient Position: Sitting, Cuff Size: Normal)   Pulse (!) 55   Temp 97.8 F (36.6 C) (Oral)   Ht 5' 7.75" (1.721 m)   Wt 139 lb 4.8 oz (63.2 kg)   SpO2 100%   BMI 21.34 kg/m    Physical Exam Constitutional:      Appearance: Normal appearance. She is normal weight.  Eyes:     Conjunctiva/sclera: Conjunctivae normal.  Pulmonary:     Effort: Pulmonary effort is normal.  Musculoskeletal:     Right hand: No swelling. Normal range of motion. Normal sensation.     Left hand: Normal. No swelling. Normal range of motion. Normal sensation.     Comments: She has tightness with dorsiflexion of both hands, no tenderness or pain, no loss of ROM  Neurological:     Mental Status: She is alert.     No results found for any visits on 12/11/22.      Assessment & Plan:   Problem List Items Addressed This Visit   None Visit Diagnoses     Paresthesia of hand, bilateral    -  Primary   Relevant Orders   Most likely a nerve compression of the BLE due to the painting she was doing in January, however she has stopped  and the numbness at night has persisted. I gave her a left wrist splint to wear at night, she may also wear them continuously during the day if they are helping. I recommended adding naproxen 220 mg BID for a month to help reduce the inflammation. I also ordered an EMG to look for carpal tunnel or other nerve compressions.   Ambulatory referral to Neurology       No orders of the defined types were placed in this encounter.   No follow-ups on file.  Farrel Conners, MD

## 2022-12-11 NOTE — Patient Instructions (Signed)
220 mg aleve twice a day at least a month duration to help reduce inflammation.

## 2022-12-15 ENCOUNTER — Ambulatory Visit: Payer: 59 | Admitting: Family Medicine

## 2022-12-22 ENCOUNTER — Encounter: Payer: Self-pay | Admitting: *Deleted

## 2023-01-26 ENCOUNTER — Ambulatory Visit: Payer: Self-pay | Admitting: Neurology

## 2023-03-02 ENCOUNTER — Ambulatory Visit: Payer: 59 | Admitting: Diagnostic Neuroimaging

## 2023-04-14 ENCOUNTER — Other Ambulatory Visit: Payer: Self-pay | Admitting: *Deleted

## 2023-04-14 DIAGNOSIS — G4700 Insomnia, unspecified: Secondary | ICD-10-CM

## 2023-04-15 MED ORDER — TRAZODONE HCL 50 MG PO TABS
25.0000 mg | ORAL_TABLET | Freq: Every evening | ORAL | 3 refills | Status: DC | PRN
Start: 2023-04-15 — End: 2023-10-12

## 2023-04-27 ENCOUNTER — Encounter: Payer: Self-pay | Admitting: Neurology

## 2023-04-27 ENCOUNTER — Ambulatory Visit: Payer: BC Managed Care – PPO | Admitting: Neurology

## 2023-04-27 ENCOUNTER — Ambulatory Visit (INDEPENDENT_AMBULATORY_CARE_PROVIDER_SITE_OTHER): Payer: BC Managed Care – PPO | Admitting: Neurology

## 2023-04-27 ENCOUNTER — Ambulatory Visit (INDEPENDENT_AMBULATORY_CARE_PROVIDER_SITE_OTHER): Payer: Self-pay | Admitting: Neurology

## 2023-04-27 VITALS — BP 112/58 | HR 72 | Ht 67.0 in | Wt 137.0 lb

## 2023-04-27 DIAGNOSIS — G5603 Carpal tunnel syndrome, bilateral upper limbs: Secondary | ICD-10-CM

## 2023-04-27 NOTE — Progress Notes (Signed)
[facility]    Provider:  Dr Lucia Gaskins Requesting Provider: Karie Georges, MD Primary Care Provider:  Karie Georges, MD  CC:  numbness in the hands  HPI:  Holly Riddle is a 47 y.o. female here as requested by Karie Georges, MD for numbness in the hands. She was painting a lot in January and starting experiencing it when she was painting. She would wake up and shake her hand out. Started wearing braces and the left feels better but still gets numbnss there, she can't do yoga or anything with pressure on the wrist, at night better with wrist splints but affecting her. Not feeling weakness, not dropping things, grip is fine but still has numbness/tingling which is positional and affecting her tasks during the day. Stable on the right, possibly on the left with the splints which elp. Pressure makes it worse.No neck pain, cervical radiculopath, no other weakness, numbness anywhere. Not in the pinky, mostly the middle fingers. No other focal neurologic deficits, associated symptoms, inciting events or modifiable factors.    Reviewed notes, labs and imaging from outside physicians, which showed:     Latest Ref Rng & Units 06/11/2022   11:59 AM 07/29/2021   10:42 AM 06/10/2021    1:13 PM  CMP  Glucose 70 - 99 mg/dL 89  161  87   BUN 6 - 23 mg/dL 14  11  13    Creatinine 0.40 - 1.20 mg/dL 0.96  0.45  4.09   Sodium 135 - 145 mEq/L 139  139  138   Potassium 3.5 - 5.1 mEq/L 4.4  3.4  3.8   Chloride 96 - 112 mEq/L 103  103  101   CO2 19 - 32 mEq/L 30  28  29    Calcium 8.4 - 10.5 mg/dL 9.6  9.2  9.7   Total Protein 6.0 - 8.3 g/dL 7.2  7.1  7.5   Total Bilirubin 0.2 - 1.2 mg/dL 0.5  0.6  0.5   Alkaline Phos 39 - 117 U/L 67  79  67   AST 0 - 37 U/L 17  17  19    ALT 0 - 35 U/L 13  14  12      06/11/2022: TSH 3.11     Latest Ref Rng & Units 06/11/2022   11:59 AM 07/29/2021   10:42 AM 06/10/2021    1:13 PM  CMP  Glucose 70 - 99 mg/dL 89  811  87   BUN 6 - 23 mg/dL  14  11  13    Creatinine 0.40 - 1.20 mg/dL 9.14  7.82  9.56   Sodium 135 - 145 mEq/L 139  139  138   Potassium 3.5 - 5.1 mEq/L 4.4  3.4  3.8   Chloride 96 - 112 mEq/L 103  103  101   CO2 19 - 32 mEq/L 30  28  29    Calcium 8.4 - 10.5 mg/dL 9.6  9.2  9.7   Total Protein 6.0 - 8.3 g/dL 7.2  7.1  7.5   Total Bilirubin 0.2 - 1.2 mg/dL 0.5  0.6  0.5   Alkaline Phos 39 - 117 U/L 67  79  67   AST 0 - 37 U/L 17  17  19    ALT 0 - 35 U/L 13  14  12       Review of Systems: Patient complains of symptoms per HPI as well as the following symptoms none. Pertinent negatives and positives per HPI. All others  negative.  Reviewed Dr. Kelton Pillar notes: Patient is in today for wrist pain and numbness/tingling in her hands. She was doing a lot of painting in her home when the symptoms started. States that it has persistent, numbness mostly at nightttime. Is wearing wrist brace at night, however it sometimes helps and sometimes does not. She is a side sleeper and has bene trying to keep her wrists stable at night. Does not have a wrist splint for her left wrist.  She also notes numbness has persisted.   Social History   Socioeconomic History   Marital status: Married    Spouse name: Not on file   Number of children: Not on file   Years of education: Not on file   Highest education level: Bachelor's degree (e.g., BA, AB, BS)  Occupational History   Not on file  Tobacco Use   Smoking status: Never   Smokeless tobacco: Never  Substance and Sexual Activity   Alcohol use: Yes    Alcohol/week: 9.0 standard drinks of alcohol    Types: 9 Glasses of wine per week   Drug use: No   Sexual activity: Not on file  Other Topics Concern   Not on file  Social History Narrative   Work or School: homemaker      Home Situation: husband, 3 children      Spiritual Beliefs: Christian      Lifestyle: runs - 8 miles per week, diet is goood            Social Determinants of Corporate investment banker Strain: Not  on file  Food Insecurity: Not on file  Transportation Needs: No Transportation Needs (12/11/2022)   PRAPARE - Administrator, Civil Service (Medical): No    Lack of Transportation (Non-Medical): No  Physical Activity: Sufficiently Active (12/11/2022)   Exercise Vital Sign    Days of Exercise per Week: 6 days    Minutes of Exercise per Session: 30 min  Stress: No Stress Concern Present (12/11/2022)   [employer] of Occupational Health - Occupational Stress Questionnaire    Feeling of Stress : Only a little  Social Connections: Unknown (12/11/2022)   Social Connection and Isolation Panel [NHANES]    Frequency of Communication with Friends and Family: More than three times a week    Frequency of Social Gatherings with Friends and Family: Three times a week    Attends Religious Services: Patient declined    Active Member of Clubs or Organizations: Patient declined    Attends Engineer, structural: Not on file    Marital Status: Married  Catering manager Violence: Not on file    Family History  Problem Relation Age of Onset   Alcohol abuse Father    Hyperlipidemia Father    Heart disease Father 44   Hypertension Father    Healthy Brother    Cancer Maternal Grandmother 43       breast   Cancer Maternal Grandfather        lung   Diabetes Maternal Grandfather    Arthritis Paternal Grandmother    Diabetes Paternal Grandmother    Mental illness Paternal Grandmother    Neuropathy Neg Hx     Past Medical History:  Diagnosis Date   Allergy    Depression     Patient Active Problem List   Diagnosis Date Noted   Hip flexor tightness, right 04/22/2018   Nonallopathic lesion of lumbosacral region 04/22/2018   Nonallopathic lesion of thoracic region  04/22/2018   Right hip pain 03/26/2018   Nonallopathic lesion of sacral region 03/26/2018   Moderate episode of recurrent major depressive disorder ([facility]) 12/17/2015   GAD (generalized anxiety disorder) 12/17/2015    Loss of transverse plantar arch of right foot 06/25/2015   Sesamoiditis 03/13/2015    Past Surgical History:  Procedure Laterality Date   ABLATION  03/2019    Current Outpatient Medications  Medication Sig Dispense Refill   cetirizine (ZYRTEC) 10 MG tablet Take 10 mg by mouth daily.     fluticasone (FLONASE) 50 MCG/ACT nasal spray Place 2 sprays into both nostrils daily. 16 g 3   sertraline (ZOLOFT) 50 MG tablet Take 1 tablet (50 mg total) by mouth daily. 90 tablet 1   traZODone (DESYREL) 50 MG tablet Take 0.5-1 tablets (25-50 mg total) by mouth at bedtime as needed for sleep. 30 tablet 3   No current facility-administered medications for this visit.    Allergies as of 04/27/2023   (No Known Allergies)    Vitals: BP (!) 112/58   Pulse 72   Ht 5\' 7"  (1.702 m)   Wt 137 lb (62.1 kg)   BMI 21.46 kg/m  Last Weight:  Wt Readings from Last 1 Encounters:  04/27/23 137 lb (62.1 kg)   Last Height:   Ht Readings from Last 1 Encounters:  04/27/23 5\' 7"  (1.702 m)     Physical exam: Exam: Gen: NAD, conversant, well nourised, well groomed                     CV: RRR, no MRG. No Carotid Bruits. No peripheral edema, warm, nontender Eyes: Conjunctivae clear without exudates or hemorrhage  Neuro: Detailed Neurologic Exam  Speech:    Speech is normal; fluent and spontaneous with normal comprehension.  Cognition:    The patient is oriented to person, place, and time;     recent and remote memory intact;     language fluent;     normal attention, concentration,     fund of knowledge Cranial Nerves:    The pupils are equal, round, and reactive to light. The fundi are normal and spontaneous venous pulsations are present. Visual fields are full to finger confrontation. Extraocular movements are intact. Trigeminal sensation is intact and the muscles of mastication are normal. The face is symmetric. The palate elevates in the midline. Hearing intact. Voice is normal. Shoulder shrug is  normal. The tongue has normal motion without fasciculations.   Coordination: nml  Gait: nml  Motor Observation:    No asymmetry, no atrophy, and no involuntary movements noted. Tone:    Normal muscle tone.    Posture:    Posture is normal. normal erect    Strength:    Strength is V/V in the upper and lower limbs.      Sensation: intact to LT     Reflex Exam:  DTR's:    Deep tendon reflexes in the upper and lower extremities are normal bilaterally.   Toes:    The toes are downgoing bilaterally.   Clonus:    Clonus is absent.   +mcphalen's sign at wrists. . +tinel's at the wrists.   Assessment/Plan:  Likely CTS we will squeeze in an emg/ncs right now and refer to hand center.   Orders Placed This Encounter  Procedures   NCV with EMG(electromyography)   Cc: Karie Georges, MD,  Karie Georges, MD  Naomie Dean, MD  [facility] [address] [place], [place] [address]  Phone [phone] Fax [phone]  I spent 30 minutes of face-to-face and non-face-to-face time with patient on the  1. Bilateral carpal tunnel syndrome    diagnosis.  This included previsit chart review, lab review, study review, order entry, electronic health record documentation, patient education on the different diagnostic and therapeutic options, counseling and coordination of care, risks and benefits of management, compliance, or risk factor reduction. This does not include time spent on emg/ncs we will squeeze in this mornng.

## 2023-04-27 NOTE — Patient Instructions (Addendum)
Emg/ncs  Carpal Tunnel Syndrome  Carpal tunnel syndrome is a condition that causes pain, numbness, and weakness in your hand and fingers. The carpal tunnel is a narrow area located on the palm side of your wrist. Repeated wrist motion or certain diseases may cause swelling within the tunnel. This swelling pinches the main nerve in the wrist. The main nerve in the wrist is called the median nerve. What are the causes? This condition may be caused by: Repeated and forceful wrist and hand motions. Wrist injuries. Arthritis. A cyst or tumor in the carpal tunnel. Fluid buildup during pregnancy. Use of tools that vibrate. Sometimes the cause of this condition is not known. What increases the risk? The following factors may make you more likely to develop this condition: Having a job that requires you to repeatedly or forcefully move your wrist or hand or requires you to use tools that vibrate. This may include jobs that involve using computers, working on an [employer], or working with power tools such as Radiographer, therapeutic. Being a woman. Having certain conditions, such as: Diabetes. Obesity. An underactive thyroid (hypothyroidism). Kidney failure. Rheumatoid arthritis. What are the signs or symptoms? Symptoms of this condition include: A tingling feeling in your fingers, especially in your thumb, index, and middle fingers. Tingling or numbness in your hand. An aching feeling in your entire arm, especially when your wrist and elbow are bent for a long time. Wrist pain that goes up your arm to your shoulder. Pain that goes down into your palm or fingers. A weak feeling in your hands. You may have trouble grabbing and holding items. Your symptoms may feel worse during the night. How is this diagnosed? This condition is diagnosed with a medical history and physical exam. You may also have tests, including: Electromyogram (EMG). This test measures electrical signals sent by your nerves  into the muscles. Nerve conduction study. This test measures how well electrical signals pass through your nerves. Imaging tests, such as X-rays, ultrasound, and MRI. These tests check for possible causes of your condition. How is this treated? This condition may be treated with: Lifestyle changes. It is important to stop or change the activity that caused your condition. Doing exercise and activities to strengthen and stretch your muscles and tendons (physical therapy). Making lifestyle changes to help with your condition and learning how to do your daily activities safely (occupational therapy). Medicines for pain and inflammation. This may include medicine that is injected into your wrist. A wrist splint or brace. Surgery. Follow these instructions at home: If you have a splint or brace: Wear the splint or brace as told by your health care provider. Remove it only as told by your health care provider. Loosen the splint or brace if your fingers tingle, become numb, or turn cold and blue. Keep the splint or brace clean. If the splint or brace is not waterproof: Do not let it get wet. Cover it with a watertight covering when you take a bath or shower. Managing pain, stiffness, and swelling If directed, put ice on the painful area. To do this: If you have a removeable splint or brace, remove it as told by your health care provider. Put ice in a plastic bag. Place a towel between your skin and the bag or between the splint or brace and the bag. Leave the ice on for 20 minutes, 2-3 times a day. Do not fall asleep with the cold pack on your skin. Remove the ice if your  skin turns bright red. This is very important. If you cannot feel pain, heat, or cold, you have a greater risk of damage to the area. Move your fingers often to reduce stiffness and swelling. General instructions Take over-the-counter and prescription medicines only as told by your health care provider. Rest your wrist and  hand from any activity that may be causing your pain. If your condition is work related, talk with your employer about changes that can be made, such as getting a wrist pad to use while typing. Do any exercises as told by your health care provider, physical therapist, or occupational therapist. Keep all follow-up visits. This is important. Contact a health care provider if: You have new symptoms. Your pain is not controlled with medicines. Your symptoms get worse. Get help right away if: You have severe numbness or tingling in your wrist or hand. Summary Carpal tunnel syndrome is a condition that causes pain, numbness, and weakness in your hand and fingers. It is usually caused by repeated wrist motions. Lifestyle changes and medicines are used to treat carpal tunnel syndrome. Surgery may be recommended. Follow your health care provider's instructions about wearing a splint, resting from activity, keeping follow-up visits, and calling for help. This information is not intended to replace advice given to you by your health care provider. Make sure you discuss any questions you have with your health care provider. Document Revised: 01/05/2020 Document Reviewed: 01/05/2020 Elsevier Patient Education  2024 [employer] Electromyoneurogram Electromyoneurogram is a test to check how well your muscles and nerves are working. This procedure includes the combined use of electromyogram (EMG) and nerve conduction study (NCS). EMG is used to evaluate muscles and the nerves that control those muscles. NCS, which is also called electroneurogram, measures how well your nerves conduct electricity. The procedures should be done together to check if your muscles and nerves are healthy. If the results of the tests are abnormal, this may indicate disease or injury, such as a neuromuscular disease or peripheral nerve damage. Tell a health care provider about: Any allergies you have. All medicines you are taking,  including vitamins, herbs, eye drops, creams, and over-the-counter medicines. Any bleeding problems you have. Any surgeries you have had. Any medical conditions you have. What are the risks? Generally, this is a safe procedure. However, problems may occur, including: Bleeding or bruising. Infection where the electrodes were inserted. What happens before the test? Medicines Take all of your usually prescribed medications before this testing is performed. Do not stop your blood thinners unless advised by your prescribing physician. General instructions Your health care provider may ask you to warm the limb that will be checked with warm water, hot pack, or wrapping the limb in a blanket. Do not use lotions or creams on the same day that you will be having the procedure. What happens during the test? For EMG  Your health care provider will ask you to stay in a position so that the muscle being studied can be accessed. You will be sitting or lying down. You may be given a medicine to numb the area (local anesthetic) and the skin will be disinfected. A very thin needle that has an electrode will be inserted into your muscle, one muscle at a time. Typically, multiple muscles are evaluated during a single study. Another small electrode will be placed on your skin near the muscle. Your health care provider will ask you to continue to remain still. The electrodes will record the electrical activity of  your muscles. You may see this on a monitor or hear it in the room. After your muscles have been studied at rest, your health care provider will ask you to contract or flex your muscles. The electrodes will record the electrical activity of your muscles. Your health care provider will remove the electrodes and the electrode needle when the procedure is finished. The procedure may vary among health care providers and hospitals. For NCS  An electrode that records your nerve activity (recording  electrode) will be placed on your skin by the muscle that is being studied. An electrode that is used as a reference (reference electrode) will be placed near the recording electrode. A paste or gel will be applied to your skin between the recording electrode and the reference electrode. Your nerve will be stimulated with a mild shock. The speed of the nerves and strength of response is recorded by the electrodes. Your health care provider will remove the electrodes and the gel when the procedure is finished. The procedure may vary among health care providers and hospitals. What can I expect after the test? It is up to you to get your test results. Ask your health care provider, or the department that is doing the test, when your results will be ready. Your health care provider may: Give you medicines for any pain. Monitor the insertion sites to make sure that bleeding stops. You should be able to drive yourself to and from the test. Discomfort can persist for a few hours after the test, but should be better the next day. Contact a health care provider if: You have swelling, redness, or drainage at any of the insertion sites. Summary Electromyoneurogram is a test to check how well your muscles and nerves are working. If the results of the tests are abnormal, this may indicate disease or injury. This is a safe procedure. However, problems may occur, such as bleeding and infection. Your health care provider will do two tests to complete this procedure. One checks your muscles (EMG) and another checks your nerves (NCS). It is up to you to get your test results. Ask your health care provider, or the department that is doing the test, when your results will be ready. This information is not intended to replace advice given to you by your health care provider. Make sure you discuss any questions you have with your health care provider. Document Revised: 05/08/2021 Document Reviewed:  04/07/2021 Elsevier Patient Education  2024 [employer].

## 2023-04-27 NOTE — Progress Notes (Unsigned)
Full Name: Holly Riddle Gender: Female MRN #: [id number] Date of Birth: [birth year]    Visit Date: 04/27/2023 08:45 Age: 47 Years Examining Physician: Dr. Naomie Dean Referring Physician: Dr. Naomie Dean Height: 5 feet 7 inch  History: Numbness and pain in the hands. Summary: NCS was performed the bilateral upper extremities. The right median/ulnar (palm) comparison nerve showed prolonged distal peak latency (Median Palm, 2.2 ms, N<2.2) and abnormal peak latency difference (Median Palm-Ulnar Palm, 0.4 ms, N<0.4) with a relative median delay.  The left median/ulnar (palm) comparison nerve showed prolonged distal peak latency (Median Palm, 2.3 ms, N<2.2) and abnormal peak latency difference (Median Palm-Ulnar Palm, 0.3 ms, N<0.4) with a relative median delay. All remaining nerves (as indicated in the following tables) were within normal limits.  EMG was performed on the right upper extremity: All muscles (as indicated in the following tables) were within normal limits.      Conclusion: There may be mild/early bilateral carpal tunnel syndrome. No evidence for cervical radiculopathy. Patient would like referral to hand center.  ------------------------------- Naomie Dean, M.D.  [facility] [address] [place], [place] [address] Tel: [phone] Fax: [phone]  Verbal informed consent was obtained from the patient, patient was informed of potential risk of procedure, including bruising, bleeding, hematoma formation, infection, muscle weakness, muscle pain, numbness, among others.        MNC    Nerve / Sites Muscle Latency Ref. Amplitude Ref. Rel Amp Segments Distance Velocity Ref. Area    ms ms mV mV %  cm m/s m/s mVms  R Median - APB     Wrist APB 4.0 ?4.4 4.1 ?4.0 100 Wrist - APB 7   18.6     Upper arm APB 9.0  3.7  89.5 Upper arm - Wrist 26 52 ?49 16.7  L Median - APB     Wrist APB 3.0 ?4.4 8.8 ?4.0 100 Wrist - APB 7   43.3     Upper  arm APB 7.9  8.5  96.4 Upper arm - Wrist 27 55 ?49 40.4  R Ulnar - ADM     Wrist ADM 2.6 ?3.3 8.1 ?6.0 100 Wrist - ADM 7   38.0     B.Elbow ADM 4.5  8.0  98.8 B.Elbow - Wrist 11.6 61 ?49 38.2     A.Elbow ADM 7.6  8.0  99.6 A.Elbow - B.Elbow 18 57 ?49 37.8  L Ulnar - ADM     Wrist ADM 2.6 ?3.3 7.6 ?6.0 100 Wrist - ADM 7   35.0     B.Elbow ADM 4.4  7.6  99.6 B.Elbow - Wrist 11.6 63 ?49 35.3     A.Elbow ADM 7.4  7.7  101 A.Elbow - B.Elbow 17 57 ?49 34.7             SNC    Nerve / Sites Rec. Site Peak Lat Ref.  Amp Ref. Segments Distance Peak Diff Ref.    ms ms V V  cm ms ms  R Median, Ulnar - Transcarpal comparison     Median Palm Wrist 2.2 ?2.2 43 ?35 Median Palm - Wrist 8       Ulnar Palm Wrist 1.8 ?2.2 45 ?12 Ulnar Palm - Wrist 8          Median Palm - Ulnar Palm  0.4 ?0.4  L Median, Ulnar - Transcarpal comparison     Median Palm Wrist 2.3 ?2.2 43 ?35 Median  Palm - Wrist 8       Ulnar Palm Wrist 2.0 ?2.2 40 ?12 Ulnar Palm - Wrist 8          Median Palm - Ulnar Palm  0.3 ?0.4  R Median - Orthodromic (Dig II, Mid palm)     Dig II Wrist 3.2 ?3.4 11 ?10 Dig II - Wrist 13    L Median - Orthodromic (Dig II, Mid palm)     Dig II Wrist 2.9 ?3.4 17 ?10 Dig II - Wrist 13    R Ulnar - Orthodromic, (Dig V, Mid palm)     Dig V Wrist 2.7 ?3.1 21 ?5 Dig V - Wrist 11    L Ulnar - Orthodromic, (Dig V, Mid palm)     Dig V Wrist 2.8 ?3.1 21 ?5 Dig V - Wrist 67                   F  Wave    Nerve F Lat Ref.   ms ms  R Ulnar - ADM 27.3 ?32.0  L Ulnar - ADM 27.4 ?32.0         EMG Summary Table    Spontaneous MUAP Recruitment  Muscle IA Fib PSW Fasc Other Amp Dur. Poly Pattern  R. Cervical paraspinals (low) Normal None None None _______ Normal Normal Normal Normal  R. Deltoid Normal None None None _______ Normal Normal Normal Normal  R. Pronator teres Normal None None None _______ Normal Normal Normal Normal  R. Triceps brachii Normal None None None _______ Normal Normal Normal Normal  R.  First dorsal interosseous Normal None None None _______ Normal Normal Normal Normal  R. Opponens pollicis Normal None None None _______ Normal Normal Normal Normal

## 2023-04-29 NOTE — Procedures (Signed)
Full Name: Holly Riddle Gender: Female MRN #: [id number] Date of Birth: [birth year]    Visit Date: 04/27/2023 08:45 Age: 47 Years Examining Physician: Dr. Naomie Dean Referring Physician: Dr. Naomie Dean Height: 5 feet 7 inch  History: Numbness and pain in the hands. Summary: NCS was performed the bilateral upper extremities. The right median/ulnar (palm) comparison nerve showed prolonged distal peak latency (Median Palm, 2.2 ms, N<2.2) and abnormal peak latency difference (Median Palm-Ulnar Palm, 0.4 ms, N<0.4) with a relative median delay.  The left median/ulnar (palm) comparison nerve showed prolonged distal peak latency (Median Palm, 2.3 ms, N<2.2) and abnormal peak latency difference (Median Palm-Ulnar Palm, 0.3 ms, N<0.4) with a relative median delay. All remaining nerves (as indicated in the following tables) were within normal limits.  EMG was performed on the right upper extremity: All muscles (as indicated in the following tables) were within normal limits.      Conclusion: There may be mild/early bilateral carpal tunnel syndrome. No evidence for cervical radiculopathy. Patient would like referral to hand center.  ------------------------------- Naomie Dean, M.D.  [facility] [address] [place], [place] [address] Tel: [phone] Fax: [phone]  Verbal informed consent was obtained from the patient, patient was informed of potential risk of procedure, including bruising, bleeding, hematoma formation, infection, muscle weakness, muscle pain, numbness, among others.        MNC    Nerve / Sites Muscle Latency Ref. Amplitude Ref. Rel Amp Segments Distance Velocity Ref. Area    ms ms mV mV %  cm m/s m/s mVms  R Median - APB     Wrist APB 4.0 ?4.4 4.1 ?4.0 100 Wrist - APB 7   18.6     Upper arm APB 9.0  3.7  89.5 Upper arm - Wrist 26 52 ?49 16.7  L Median - APB     Wrist APB 3.0 ?4.4 8.8 ?4.0 100 Wrist - APB 7   43.3     Upper  arm APB 7.9  8.5  96.4 Upper arm - Wrist 27 55 ?49 40.4  R Ulnar - ADM     Wrist ADM 2.6 ?3.3 8.1 ?6.0 100 Wrist - ADM 7   38.0     B.Elbow ADM 4.5  8.0  98.8 B.Elbow - Wrist 11.6 61 ?49 38.2     A.Elbow ADM 7.6  8.0  99.6 A.Elbow - B.Elbow 18 57 ?49 37.8  L Ulnar - ADM     Wrist ADM 2.6 ?3.3 7.6 ?6.0 100 Wrist - ADM 7   35.0     B.Elbow ADM 4.4  7.6  99.6 B.Elbow - Wrist 11.6 63 ?49 35.3     A.Elbow ADM 7.4  7.7  101 A.Elbow - B.Elbow 17 57 ?49 34.7             SNC    Nerve / Sites Rec. Site Peak Lat Ref.  Amp Ref. Segments Distance Peak Diff Ref.    ms ms V V  cm ms ms  R Median, Ulnar - Transcarpal comparison     Median Palm Wrist 2.2 ?2.2 43 ?35 Median Palm - Wrist 8       Ulnar Palm Wrist 1.8 ?2.2 45 ?12 Ulnar Palm - Wrist 8          Median Palm - Ulnar Palm  0.4 ?0.4  L Median, Ulnar - Transcarpal comparison     Median Palm Wrist 2.3 ?2.2 43 ?35 Median  Palm - Wrist 8       Ulnar Palm Wrist 2.0 ?2.2 40 ?12 Ulnar Palm - Wrist 8          Median Palm - Ulnar Palm  0.3 ?0.4  R Median - Orthodromic (Dig II, Mid palm)     Dig II Wrist 3.2 ?3.4 11 ?10 Dig II - Wrist 13    L Median - Orthodromic (Dig II, Mid palm)     Dig II Wrist 2.9 ?3.4 17 ?10 Dig II - Wrist 13    R Ulnar - Orthodromic, (Dig V, Mid palm)     Dig V Wrist 2.7 ?3.1 21 ?5 Dig V - Wrist 11    L Ulnar - Orthodromic, (Dig V, Mid palm)     Dig V Wrist 2.8 ?3.1 21 ?5 Dig V - Wrist 67                   F  Wave    Nerve F Lat Ref.   ms ms  R Ulnar - ADM 27.3 ?32.0  L Ulnar - ADM 27.4 ?32.0         EMG Summary Table    Spontaneous MUAP Recruitment  Muscle IA Fib PSW Fasc Other Amp Dur. Poly Pattern  R. Cervical paraspinals (low) Normal None None None _______ Normal Normal Normal Normal  R. Deltoid Normal None None None _______ Normal Normal Normal Normal  R. Pronator teres Normal None None None _______ Normal Normal Normal Normal  R. Triceps brachii Normal None None None _______ Normal Normal Normal Normal  R.  First dorsal interosseous Normal None None None _______ Normal Normal Normal Normal  R. Opponens pollicis Normal None None None _______ Normal Normal Normal Normal

## 2023-04-30 ENCOUNTER — Telehealth: Payer: Self-pay | Admitting: Neurology

## 2023-04-30 NOTE — Telephone Encounter (Signed)
Referral for hand surgery fax to [facility], [place]. Phone: [phone], Fax: [phone].

## 2023-05-05 ENCOUNTER — Telehealth: Payer: Self-pay | Admitting: *Deleted

## 2023-05-05 NOTE — Telephone Encounter (Signed)
Referral placed as below.  

## 2023-05-05 NOTE — Telephone Encounter (Signed)
-----   Message from Karie Georges sent at 05/05/2023 10:53 AM EDT ----- Please send in a referral to the hand surgeon for carpal tunnel per neurology's note. Thanks! ----- Message ----- From: Anson Fret, MD Sent: 04/29/2023   2:35 PM EDT To: Karie Georges, MD

## 2023-05-05 NOTE — Addendum Note (Signed)
Addended by: Johnella Moloney on: 05/05/2023 11:32 AM   Modules accepted: Orders

## 2023-05-07 ENCOUNTER — Telehealth: Payer: Self-pay | Admitting: Family Medicine

## 2023-05-07 NOTE — Telephone Encounter (Signed)
Pattricia Boss with [facility] called to ask why Pt has 2 referrals for hand surgery? [phone]  Pattricia Boss already sent one referral to [facility] on 04/30/23  Please call her back to discuss.

## 2023-05-12 NOTE — Telephone Encounter (Signed)
Looks like the neurologist did one as well as me. Ok to cancel the one I put in.

## 2023-05-15 ENCOUNTER — Other Ambulatory Visit: Payer: Self-pay | Admitting: Obstetrics and Gynecology

## 2023-05-15 DIAGNOSIS — R928 Other abnormal and inconclusive findings on diagnostic imaging of breast: Secondary | ICD-10-CM

## 2023-05-25 ENCOUNTER — Ambulatory Visit
Admission: RE | Admit: 2023-05-25 | Discharge: 2023-05-25 | Disposition: A | Discharge status: Home / Self Care | Payer: BC Managed Care – PPO | Source: Ambulatory Visit | Attending: Obstetrics and Gynecology | Admitting: Obstetrics and Gynecology

## 2023-05-25 ENCOUNTER — Other Ambulatory Visit: Payer: Self-pay | Admitting: Obstetrics and Gynecology

## 2023-05-25 DIAGNOSIS — N6310 Unspecified lump in the right breast, unspecified quadrant: Secondary | ICD-10-CM

## 2023-05-25 DIAGNOSIS — R928 Other abnormal and inconclusive findings on diagnostic imaging of breast: Secondary | ICD-10-CM

## 2023-05-26 ENCOUNTER — Ambulatory Visit
Admission: RE | Admit: 2023-05-26 | Discharge: 2023-05-26 | Disposition: A | Discharge status: Home / Self Care | Payer: BC Managed Care – PPO | Source: Ambulatory Visit | Attending: Obstetrics and Gynecology | Admitting: Obstetrics and Gynecology

## 2023-05-26 DIAGNOSIS — N6310 Unspecified lump in the right breast, unspecified quadrant: Secondary | ICD-10-CM

## 2023-05-26 DIAGNOSIS — R928 Other abnormal and inconclusive findings on diagnostic imaging of breast: Secondary | ICD-10-CM

## 2023-05-26 HISTORY — PX: BREAST BIOPSY: SHX20

## 2023-06-09 ENCOUNTER — Other Ambulatory Visit: Payer: Self-pay | Admitting: Family Medicine

## 2023-06-09 DIAGNOSIS — F411 Generalized anxiety disorder: Secondary | ICD-10-CM

## 2023-06-11 ENCOUNTER — Encounter: Payer: Self-pay | Admitting: Family Medicine

## 2023-06-11 DIAGNOSIS — F411 Generalized anxiety disorder: Secondary | ICD-10-CM

## 2023-06-12 MED ORDER — SERTRALINE HCL 50 MG PO TABS
50.0000 mg | ORAL_TABLET | Freq: Every day | ORAL | 1 refills | Status: DC
Start: 2023-06-12 — End: 2023-10-12

## 2023-08-13 IMAGING — DX DG CHEST 2V
2 series · 2 of 2 positions shown · non-contrast
Comparison: None.

CLINICAL DATA: Cough.

EXAM:
CHEST - 2 VIEW

[chest pa]
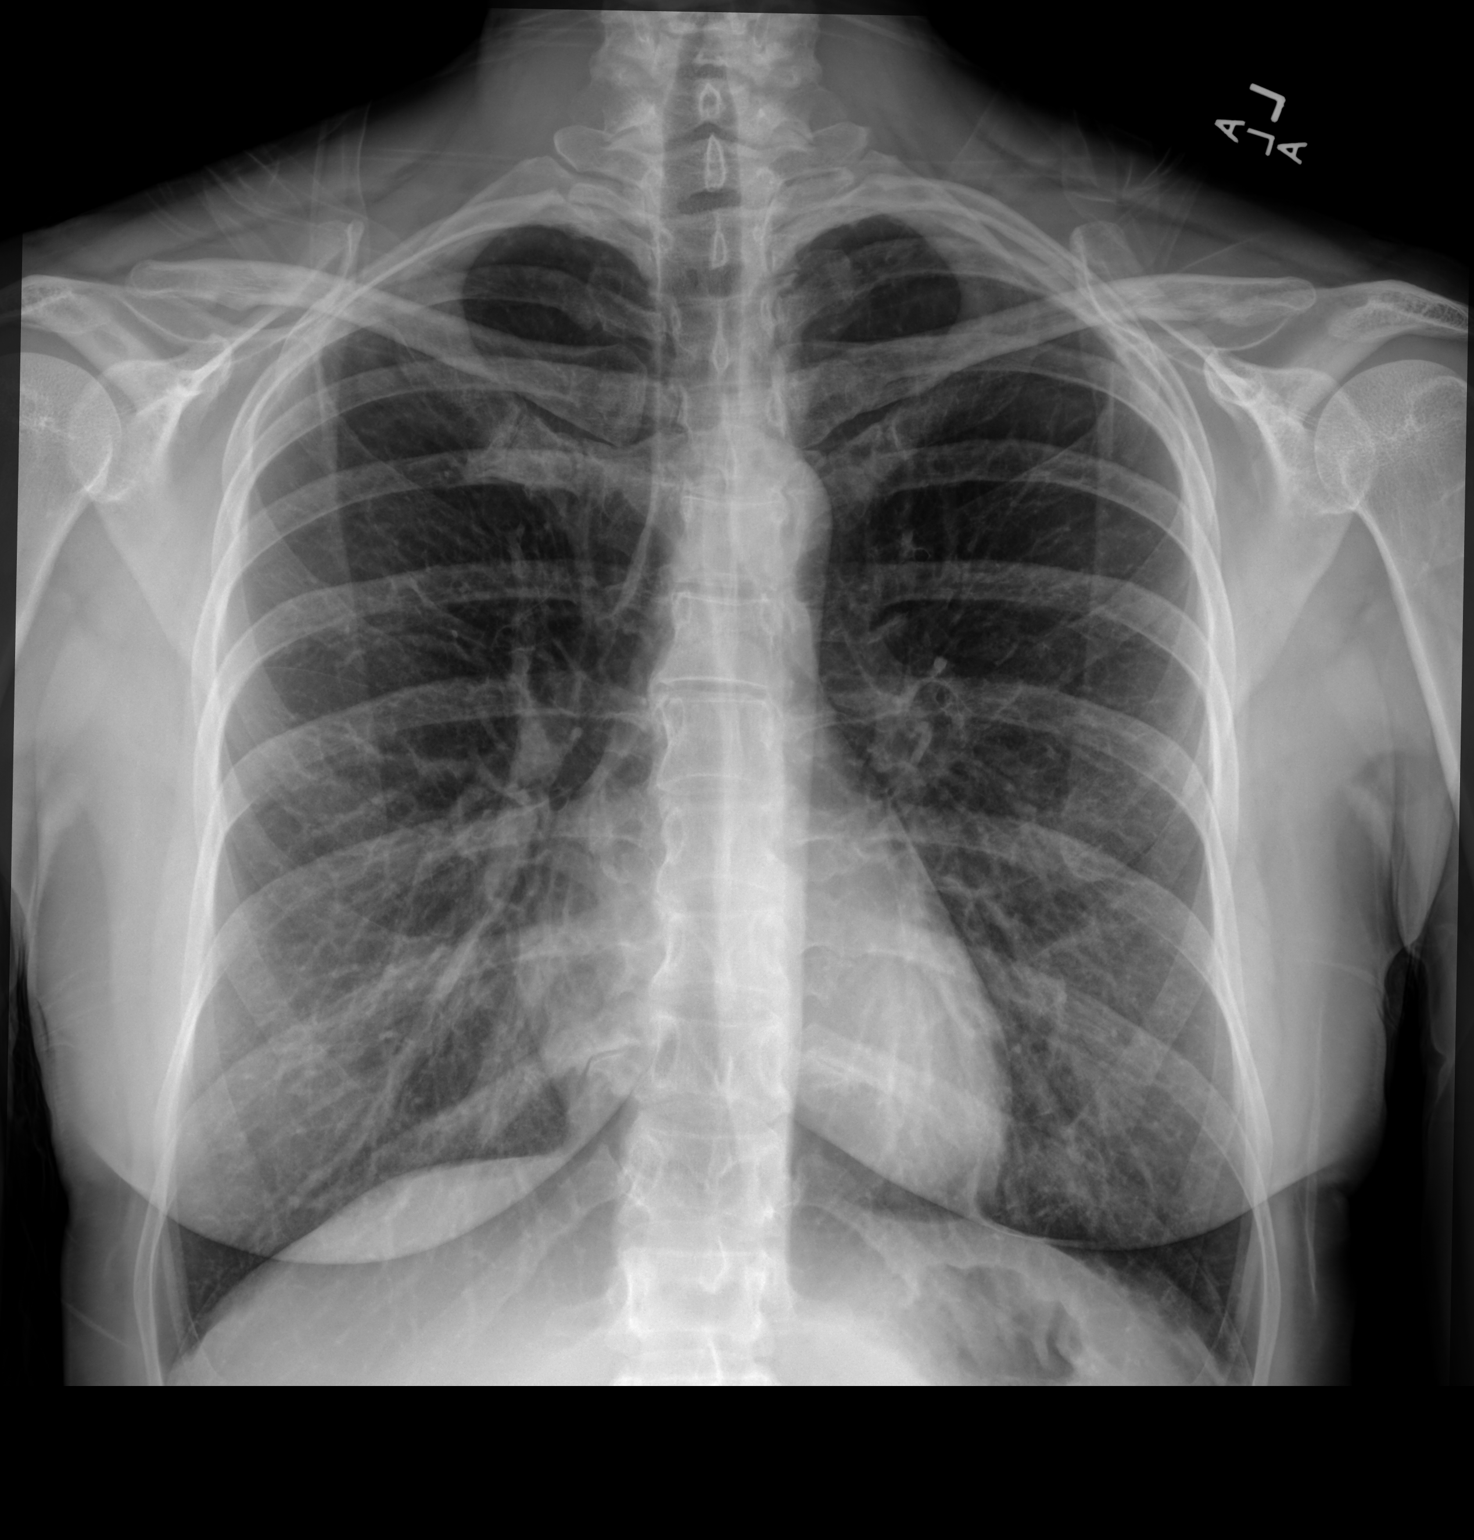

[chest lat]
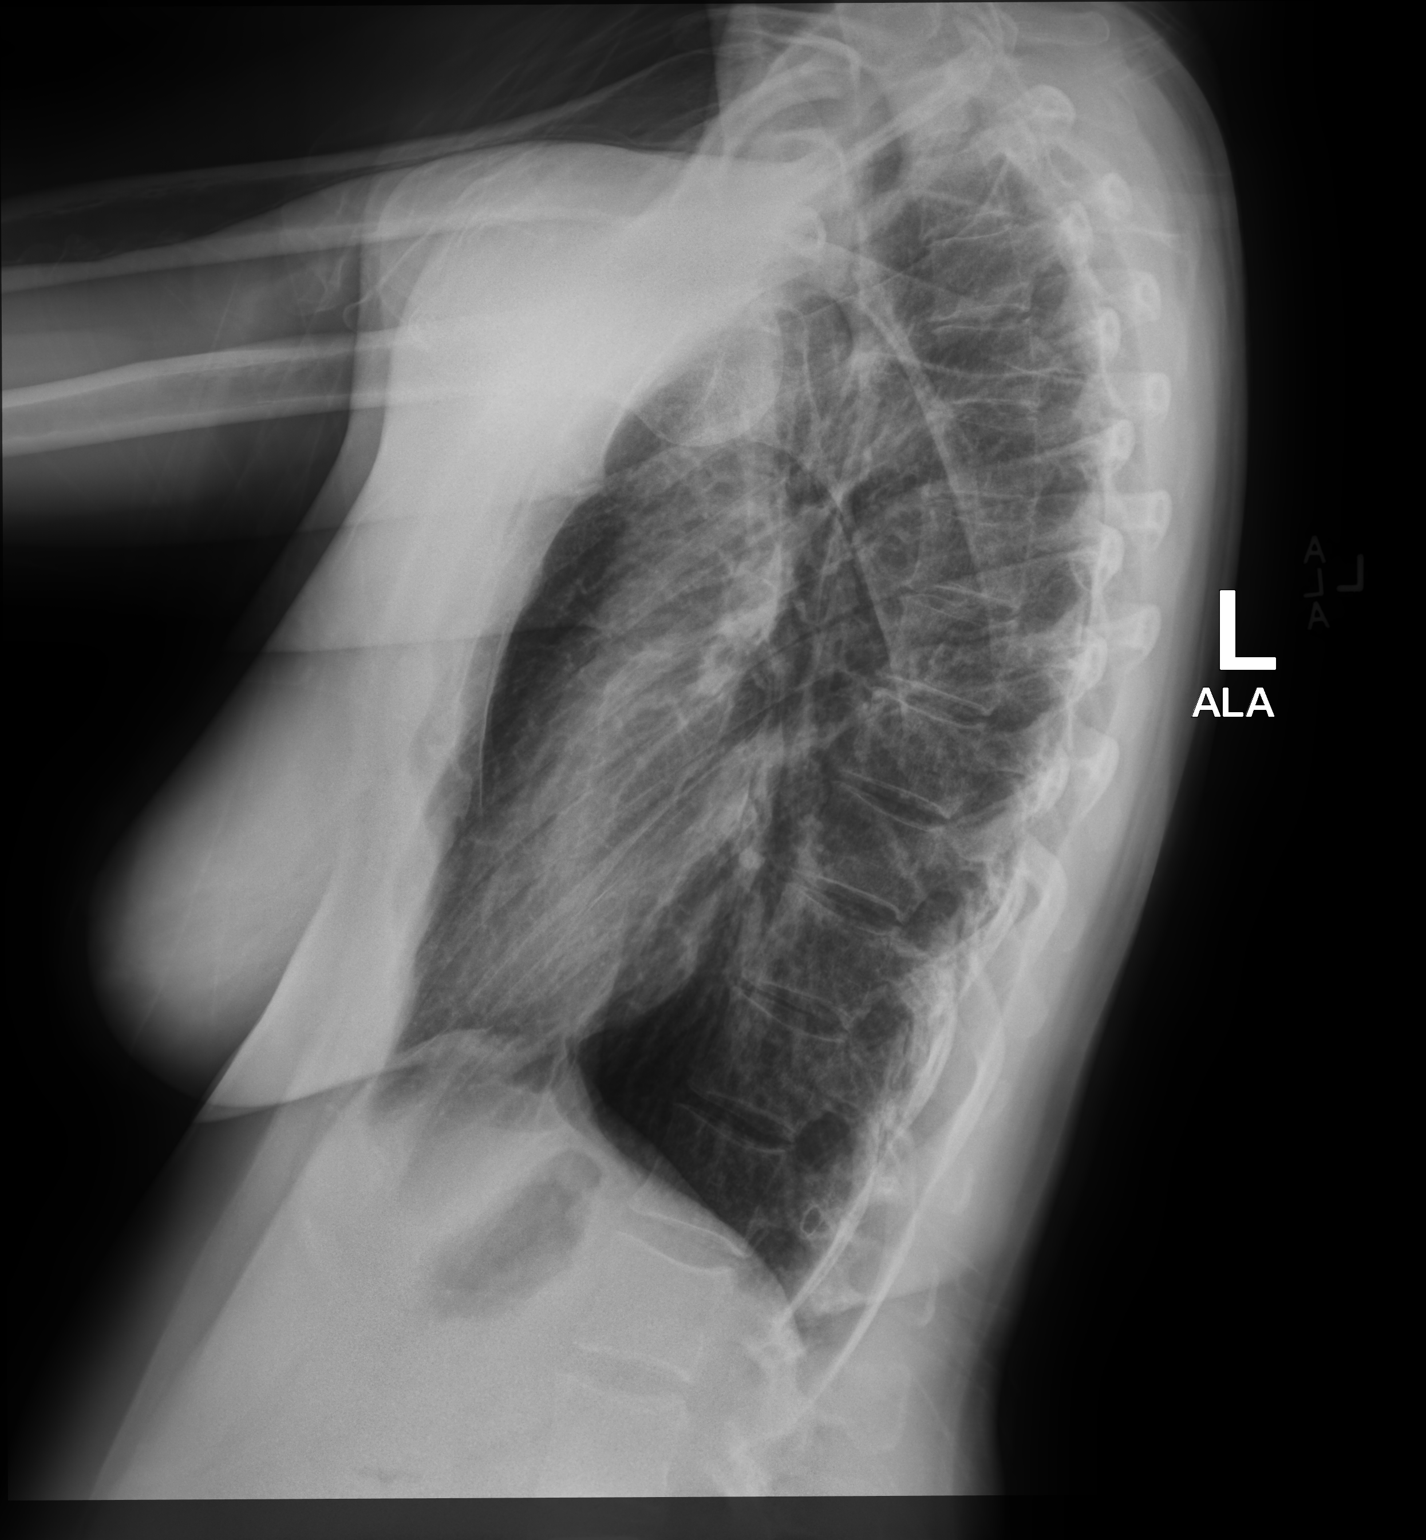

[2 of 2 positions shown; findings below may reference images not displayed]

FINDINGS: Normal heart size. Normal mediastinal contour. No pneumothorax. No
pleural effusion. No pulmonary edema. No acute consolidative
airspace disease. Vague 8 mm nodular opacity in apical right lung.
IMPRESSION: Vague 8 mm nodular opacity in the apical right lung. Otherwise no
active disease. Recommend further evaluation with noncontrast chest
CT.

## 2023-09-06 IMAGING — CT CT CHEST W/O CM
1 series · 16 of 34 positions shown, 20 images · non-contrast
Comparison: July 29, 2021.

CLINICAL DATA: Chest pain, lung nodule.

EXAM:
CT CHEST WITHOUT CONTRAST
TECHNIQUE: Multidetector CT imaging of the chest was performed following the
standard protocol without IV contrast.

[Series 2: chest w/(date) · axial · 0.73mm/px · z∈[+634,+962]mm · 16 of 185 slices shown, 20 images]
[im 14/185  mediastinal]
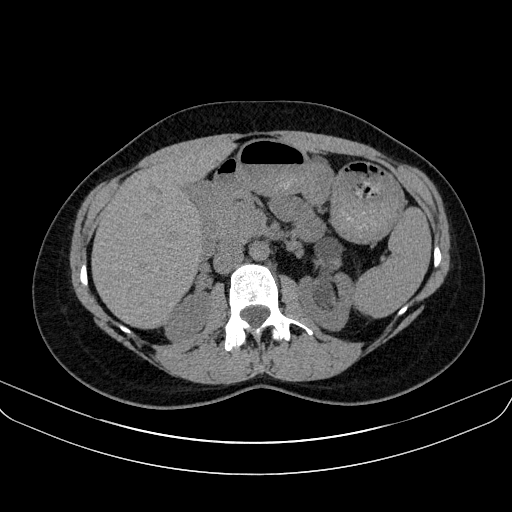
[im 14/185  lung]
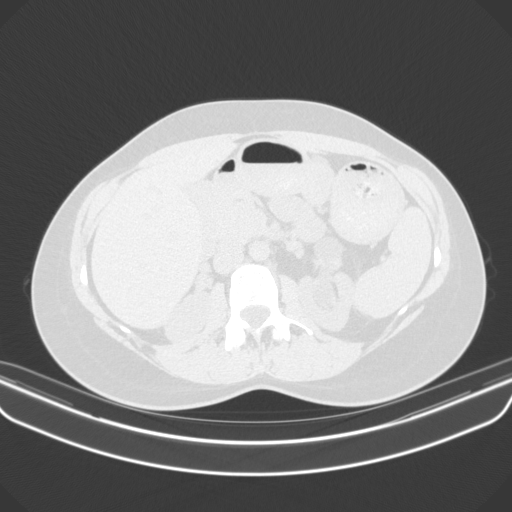
[im 28/185  lung]
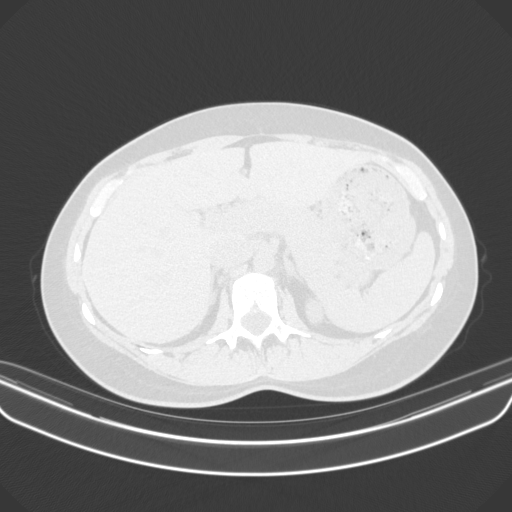
[im 37/185  lung]
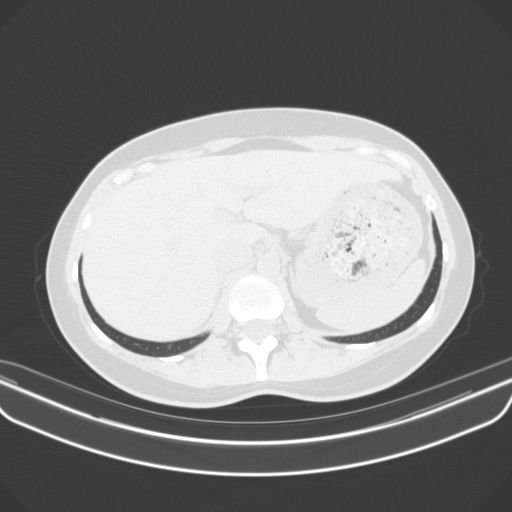
[im 48/185  lung]
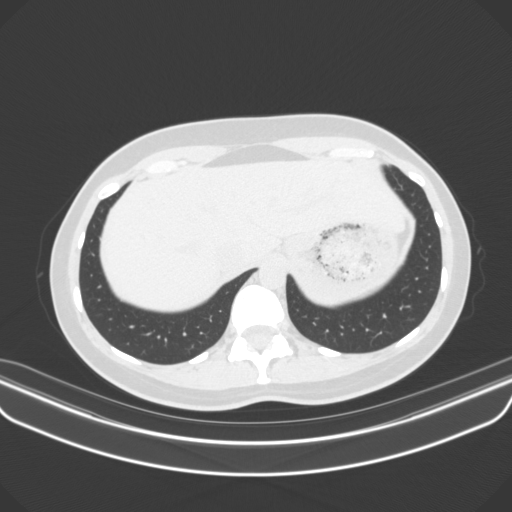
[im 62/185  mediastinal]
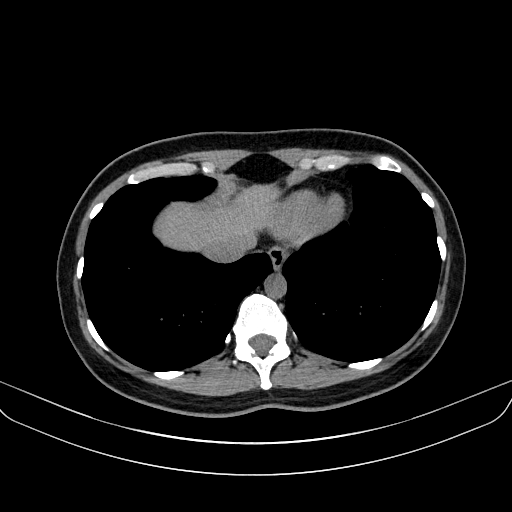
[im 62/185  lung]
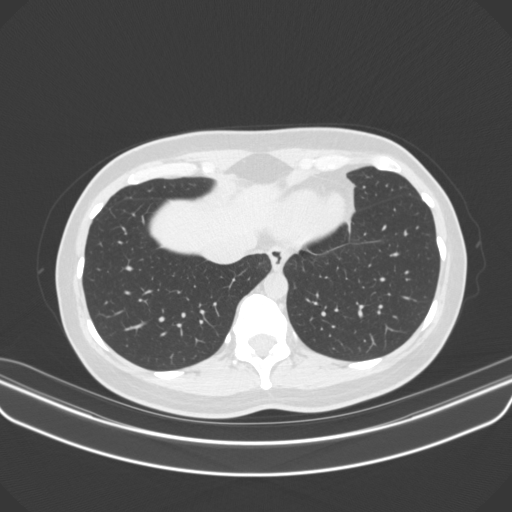
[im 74/185  lung]
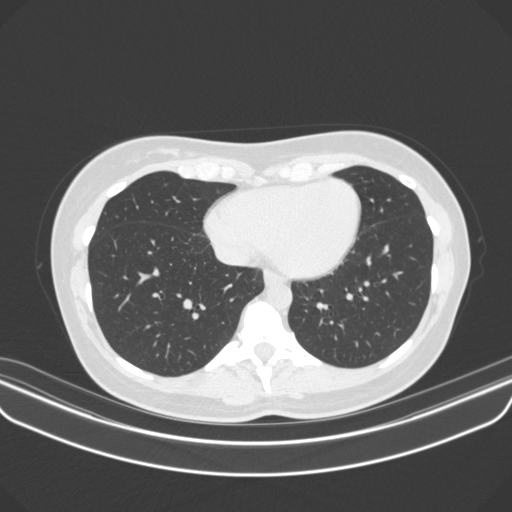
[im 82/185  lung]
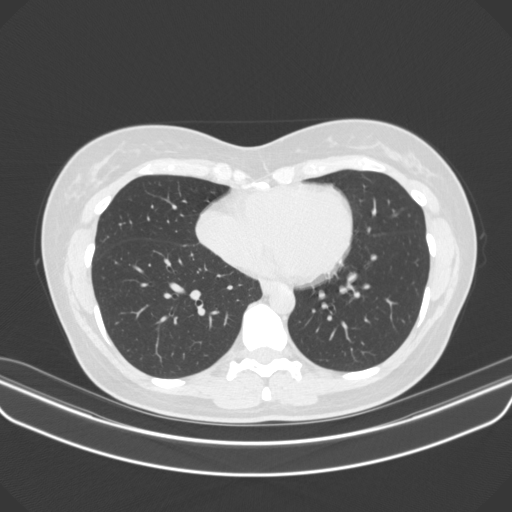
[im 89/185  lung]
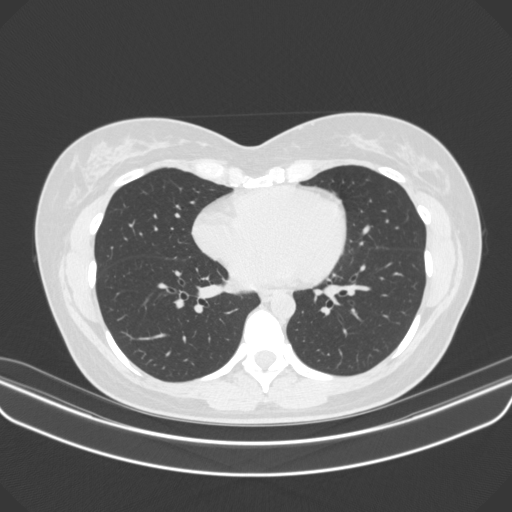
[im 98/185  mediastinal]
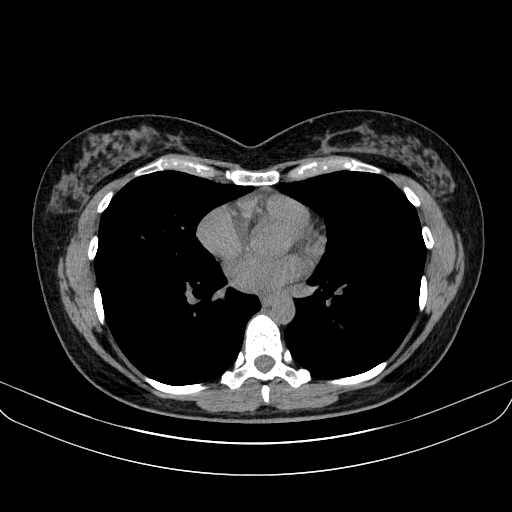
[im 98/185  lung]
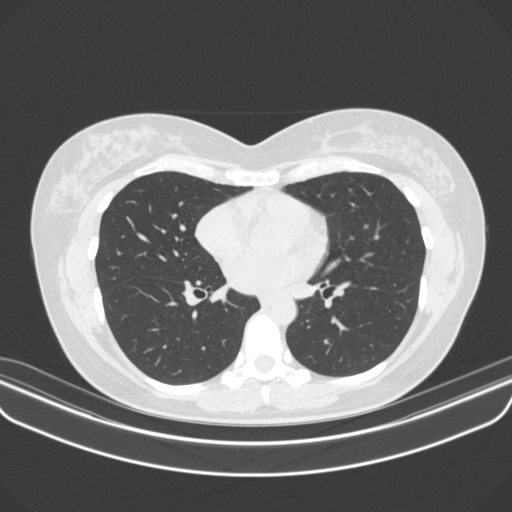
[im 110/185  lung]
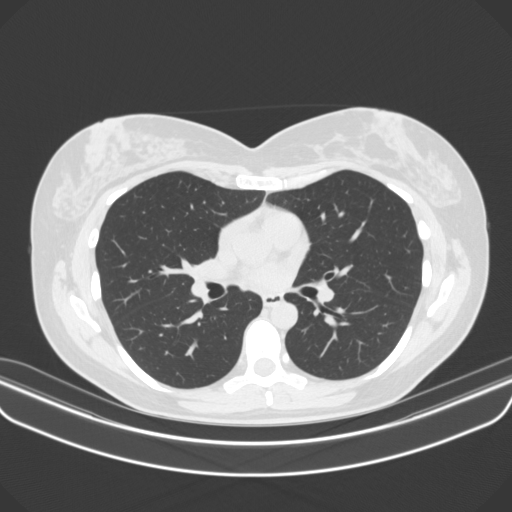
[im 116/185  lung]
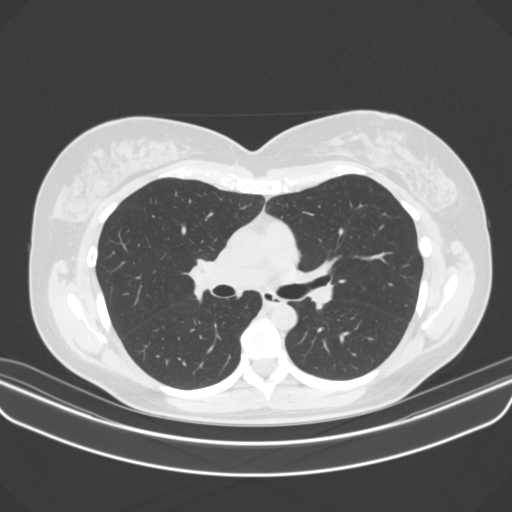
[im 130/185  lung]
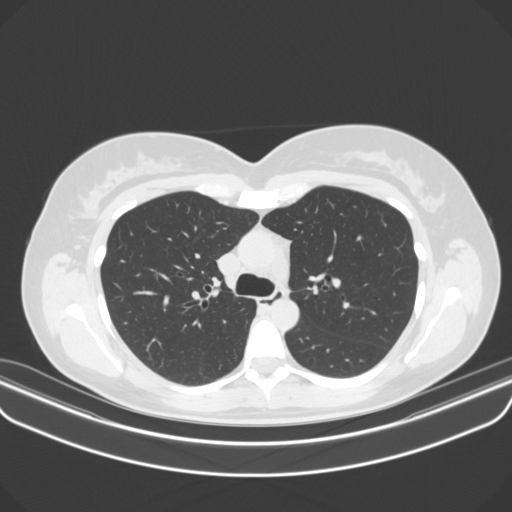
[im 144/185  mediastinal]
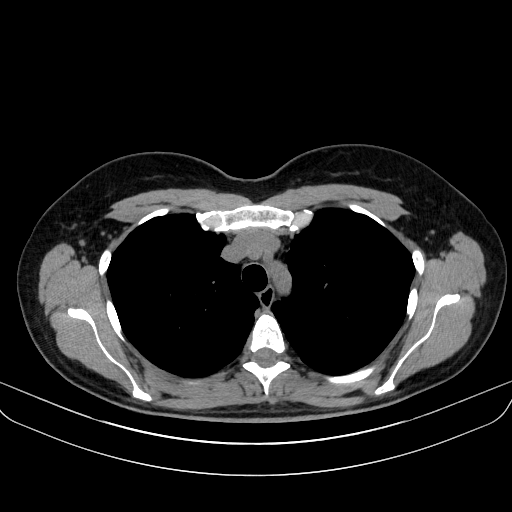
[im 144/185  lung]
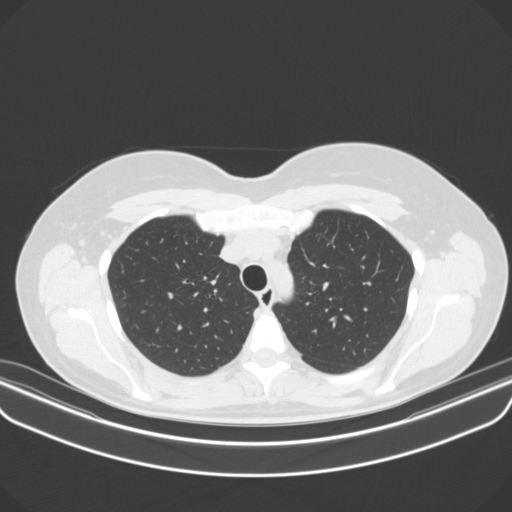
[im 150/185  lung]
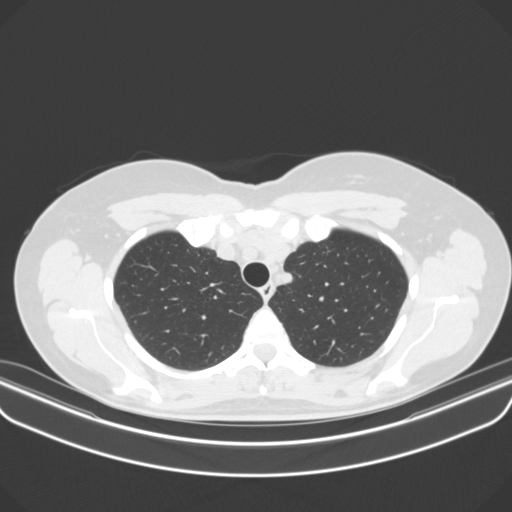
[im 164/185  lung]
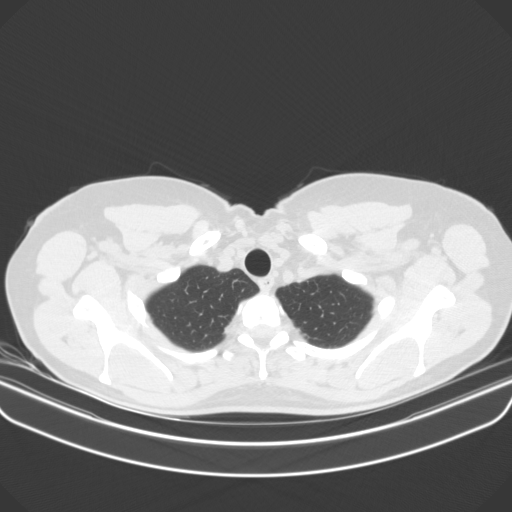
[im 178/185  lung]
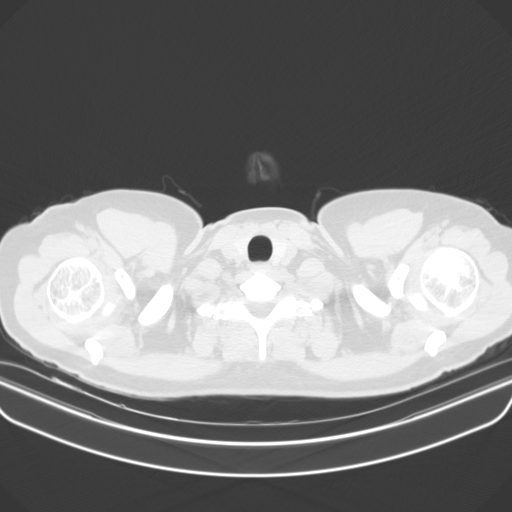

[16 of 34 positions shown; findings below may reference images not displayed]

FINDINGS: Cardiovascular: No significant vascular findings. Normal heart size.
No pericardial effusion.

Mediastinum/Nodes: No enlarged mediastinal or axillary lymph nodes.
Thyroid gland, trachea, and esophagus demonstrate no significant
findings.

Lungs/Pleura: Lungs are clear. No pleural effusion or pneumothorax.

Upper Abdomen: Possible left hydronephrosis may be present.

Musculoskeletal: No chest wall mass or suspicious bone lesions
identified.
IMPRESSION: No definite abnormality seen in the chest.

Possible left hydronephrosis is noted in the visualized abdomen.

## 2023-10-12 ENCOUNTER — Encounter: Payer: Self-pay | Admitting: Family Medicine

## 2023-10-12 ENCOUNTER — Ambulatory Visit (INDEPENDENT_AMBULATORY_CARE_PROVIDER_SITE_OTHER): Payer: Self-pay | Admitting: Family Medicine

## 2023-10-12 VITALS — BP 100/62 | HR 58 | Temp 98.2°F | Ht 67.0 in | Wt 140.6 lb

## 2023-10-12 DIAGNOSIS — Z13220 Encounter for screening for lipoid disorders: Secondary | ICD-10-CM

## 2023-10-12 DIAGNOSIS — F411 Generalized anxiety disorder: Secondary | ICD-10-CM | POA: Diagnosis not present

## 2023-10-12 DIAGNOSIS — Z23 Encounter for immunization: Secondary | ICD-10-CM | POA: Diagnosis not present

## 2023-10-12 DIAGNOSIS — Z0000 Encounter for general adult medical examination without abnormal findings: Secondary | ICD-10-CM

## 2023-10-12 DIAGNOSIS — Z1211 Encounter for screening for malignant neoplasm of colon: Secondary | ICD-10-CM

## 2023-10-12 LAB — CBC WITH DIFFERENTIAL/PLATELET
Basophils Absolute: 0 10*3/uL (ref 0.0–0.1)
Basophils Relative: 0.5 % (ref 0.0–3.0)
Eosinophils Absolute: 0.1 10*3/uL (ref 0.0–0.7)
Eosinophils Relative: 2.7 % (ref 0.0–5.0)
HCT: 45.6 % (ref 36.0–46.0)
Hemoglobin: 15.4 g/dL — ABNORMAL HIGH (ref 12.0–15.0)
Lymphocytes Relative: 38.6 % (ref 12.0–46.0)
Lymphs Abs: 1.8 10*3/uL (ref 0.7–4.0)
MCHC: 33.7 g/dL (ref 30.0–36.0)
MCV: 91.5 fL (ref 78.0–100.0)
Monocytes Absolute: 0.4 10*3/uL (ref 0.1–1.0)
Monocytes Relative: 8.7 % (ref 3.0–12.0)
Neutro Abs: 2.4 10*3/uL (ref 1.4–7.7)
Neutrophils Relative %: 49.5 % (ref 43.0–77.0)
Platelets: 267 10*3/uL (ref 150.0–400.0)
RBC: 4.98 Mil/uL (ref 3.87–5.11)
RDW: 12.4 % (ref 11.5–15.5)
WBC: 4.8 10*3/uL (ref 4.0–10.5)

## 2023-10-12 LAB — LIPID PANEL
Cholesterol: 171 mg/dL (ref 0–200)
HDL: 58.2 mg/dL (ref >39.00)
LDL Cholesterol: 94 mg/dL (ref 0–99)
NonHDL: 112.49
Total CHOL/HDL Ratio: 3
Triglycerides: 94 mg/dL (ref 0.0–149.0)
VLDL: 18.8 mg/dL (ref 0.0–40.0)

## 2023-10-12 LAB — COMPREHENSIVE METABOLIC PANEL
ALT: 14 U/L (ref 0–35)
AST: 19 U/L (ref 0–37)
Albumin: 4.7 g/dL (ref 3.5–5.2)
Alkaline Phosphatase: 60 U/L (ref 39–117)
BUN: 15 mg/dL (ref 6–23)
CO2: 26 meq/L (ref 19–32)
Calcium: 9.1 mg/dL (ref 8.4–10.5)
Chloride: 103 meq/L (ref 96–112)
Creatinine, Ser: 0.9 mg/dL (ref 0.40–1.20)
GFR: 77.2 mL/min (ref >60.00)
Glucose, Bld: 87 mg/dL (ref 70–99)
Potassium: 4 meq/L (ref 3.5–5.1)
Sodium: 137 meq/L (ref 135–145)
Total Bilirubin: 0.6 mg/dL (ref 0.2–1.2)
Total Protein: 7.6 g/dL (ref 6.0–8.3)

## 2023-10-12 MED ORDER — SERTRALINE HCL 50 MG PO TABS: 50.0000 mg | ORAL_TABLET | Freq: Every day | ORAL | 1 refills | Status: DC

## 2023-10-12 NOTE — Patient Instructions (Signed)

## 2023-10-12 NOTE — Progress Notes (Signed)
Complete physical exam  Patient: Holly Riddle   DOB: 06/10/1976   47 y.o. Female  MRN: [id number]  Subjective:    Chief Complaint  Patient presents with   Annual Exam    Holly Riddle is a 48 y.o. female who presents today for a complete physical exam. She reports consuming a general diet. Gym/ health club routine includes cardio, light weights, and treadmill. She generally feels well. She reports sleeping well. She does not have additional problems to discuss today.    Most recent fall risk assessment:     No data to display           Most recent depression screenings:    10/12/2023    9:07 AM 10/23/2022    9:46 AM  PHQ 2/9 Scores  PHQ - 2 Score 0 1  PHQ- 9 Score 1 7    Vision:Within last year and no vision problems and Dental: No current dental problems and Receives regular dental care  Patient Active Problem List   Diagnosis Date Noted   Hip flexor tightness, right 04/22/2018   Nonallopathic lesion of lumbosacral region 04/22/2018   Nonallopathic lesion of thoracic region 04/22/2018   Right hip pain 03/26/2018   Nonallopathic lesion of sacral region 03/26/2018   Moderate episode of recurrent major depressive disorder (HCC) 12/17/2015   GAD (generalized anxiety disorder) 12/17/2015   Loss of transverse plantar arch of right foot 06/25/2015   Sesamoiditis 03/13/2015      Patient Care Team: Karie Georges, MD as PCP - General (Family Medicine) Zelphia Cairo, MD as Attending Physician (Obstetrics and Gynecology)   Outpatient Medications Prior to Visit  Medication Sig   cetirizine (ZYRTEC) 10 MG tablet Take 10 mg by mouth daily.   cholecalciferol (VITAMIN D3) 25 MCG (1000 UNIT) tablet Take 1,000 Units by mouth daily.   fluticasone (FLONASE) 50 MCG/ACT nasal spray Place 2 sprays into both nostrils daily.   [DISCONTINUED] sertraline (ZOLOFT) 50 MG tablet Take 1 tablet (50 mg total) by mouth daily.   [DISCONTINUED] traZODone (DESYREL) 50 MG tablet Take  0.5-1 tablets (25-50 mg total) by mouth at bedtime as needed for sleep.   No facility-administered medications prior to visit.    Review of Systems  HENT:  Negative for hearing loss.   Eyes:  Negative for blurred vision.  Respiratory:  Negative for shortness of breath.   Cardiovascular:  Negative for chest pain.  Gastrointestinal: Negative.   Genitourinary: Negative.   Musculoskeletal:  Negative for back pain.  Neurological:  Negative for headaches.  Psychiatric/Behavioral:  Negative for depression.   All other systems reviewed and are negative.      Objective:     BP 100/62   Pulse (!) 58   Temp 98.2 F (36.8 C) (Oral)   Ht 5\' 7"  (1.702 m)   Wt 140 lb 9.6 oz (63.8 kg)   SpO2 99%   BMI 22.02 kg/m    Physical Exam Vitals reviewed.  Constitutional:      Appearance: Normal appearance. She is well-groomed and normal weight.  HENT:     Right Ear: Tympanic membrane and ear canal normal.     Left Ear: Tympanic membrane and ear canal normal.     Mouth/Throat:     Mouth: Mucous membranes are moist.     Pharynx: No posterior oropharyngeal erythema.  Eyes:     Conjunctiva/sclera: Conjunctivae normal.  Neck:     Thyroid: No thyromegaly.  Cardiovascular:     Rate and Rhythm:  Normal rate and regular rhythm.     Pulses: Normal pulses.     Heart sounds: S1 normal and S2 normal.  Pulmonary:     Effort: Pulmonary effort is normal.     Breath sounds: Normal breath sounds and air entry.  Abdominal:     General: Abdomen is flat. Bowel sounds are normal.     Palpations: Abdomen is soft.  Musculoskeletal:     Right lower leg: No edema.     Left lower leg: No edema.  Lymphadenopathy:     Cervical: No cervical adenopathy.  Neurological:     Mental Status: She is alert and oriented to person, place, and time. Mental status is at baseline.     Gait: Gait is intact.  Psychiatric:        Mood and Affect: Mood and affect normal.        Speech: Speech normal.        Behavior:  Behavior normal.        Judgment: Judgment normal.      No results found for any visits on 10/12/23.     Assessment & Plan:    Routine Health Maintenance and Physical Exam  Immunization History  Administered Date(s) Administered   Influenza,inj,Quad PF,6+ Mos 06/01/2020, 06/10/2021, 06/11/2022   Moderna Sars-Covid-2 Vaccination 11/25/2019, 12/27/2019, 08/08/2020   Tdap 02/06/2009, 04/01/2019    Health Maintenance  Topic Date Due   Colonoscopy  Never done   INFLUENZA VACCINE  04/09/2023   COVID-19 Vaccine (4 - 2024-25 season) 05/10/2023   HIV Screening  10/11/2024 (Originally [birth year]01[birth year]1993)   Cervical Cancer Screening (HPV/Pap Cotest)  03/24/2025   DTaP/Tdap/Td (3 - Td or Tdap) 03/31/2029   Hepatitis C Screening  Completed   HPV VACCINES  Aged Out    Discussed health benefits of physical activity, and encouraged her to engage in regular exercise appropriate for her age and condition.  Lipid screening -     Lipid panel; Future  GAD (generalized anxiety disorder) -     Sertraline HCl; Take 1 tablet (50 mg total) by mouth daily.  Dispense: 90 tablet; Refill: 1  Routine general medical examination at a health care facility -     Comprehensive metabolic panel -     CBC with Differential/Platelet  Colon cancer screening -     Cologuard  Normal physical exam findings today, counseled patient on colonoscopy screening vs. Cologuard, pros/cons of each test. Pt wants to do the cologuard. Will place orders. Handouts given on healthy eating and exercise. Labs ordered for annual surveillance.   Return in 1 year (on 10/11/2024).     Karie Georges, MD

## 2023-10-13 ENCOUNTER — Encounter: Payer: Self-pay | Admitting: Family Medicine

## 2023-10-30 LAB — COLOGUARD: COLOGUARD: NEGATIVE

## 2024-06-06 ENCOUNTER — Other Ambulatory Visit: Payer: Self-pay | Admitting: Family Medicine

## 2024-06-06 DIAGNOSIS — F411 Generalized anxiety disorder: Secondary | ICD-10-CM

## 2024-10-18 ENCOUNTER — Encounter: Admitting: Family Medicine
